# Patient Record
Sex: Male | Born: 1955 | Race: White | Hispanic: No | Marital: Married | Smoking: Former smoker
Health system: Southern US, Academic
[De-identification: ages and names within clinical notes are randomized; demographics above are authoritative.]

## PROBLEM LIST (undated history)

## (undated) DIAGNOSIS — E7800 Pure hypercholesterolemia, unspecified: Secondary | ICD-10-CM

## (undated) DIAGNOSIS — K279 Peptic ulcer, site unspecified, unspecified as acute or chronic, without hemorrhage or perforation: Secondary | ICD-10-CM

## (undated) DIAGNOSIS — E039 Hypothyroidism, unspecified: Secondary | ICD-10-CM

## (undated) DIAGNOSIS — I4891 Unspecified atrial fibrillation: Secondary | ICD-10-CM

## (undated) DIAGNOSIS — H905 Unspecified sensorineural hearing loss: Secondary | ICD-10-CM

## (undated) DIAGNOSIS — E785 Hyperlipidemia, unspecified: Secondary | ICD-10-CM

## (undated) DIAGNOSIS — I10 Essential (primary) hypertension: Secondary | ICD-10-CM

## (undated) DIAGNOSIS — N200 Calculus of kidney: Secondary | ICD-10-CM

## (undated) DIAGNOSIS — E781 Pure hyperglyceridemia: Secondary | ICD-10-CM

## (undated) DIAGNOSIS — M1990 Unspecified osteoarthritis, unspecified site: Secondary | ICD-10-CM

## (undated) DIAGNOSIS — K219 Gastro-esophageal reflux disease without esophagitis: Secondary | ICD-10-CM

## (undated) HISTORY — PX: HX ROTATOR CUFF REPAIR: SHX139

## (undated) HISTORY — PX: HAND SURGERY: SHX662

## (undated) HISTORY — PX: HX GALL BLADDER SURGERY/CHOLE: SHX55

---

## 1997-05-22 ENCOUNTER — Inpatient Hospital Stay (HOSPITAL_COMMUNITY): Payer: Self-pay

## 2013-10-11 IMAGING — CR XRAY KNEE COMPLETE LT
1 series · 3 of 3 positions shown · non-contrast
Comparison: None.

Exam:   
Left knee 3V
INDICATION: Pain.

[Series 1: view not recorded · oblique · 0.17mm/px · 3 of 3 slices shown]
[im 1/3]
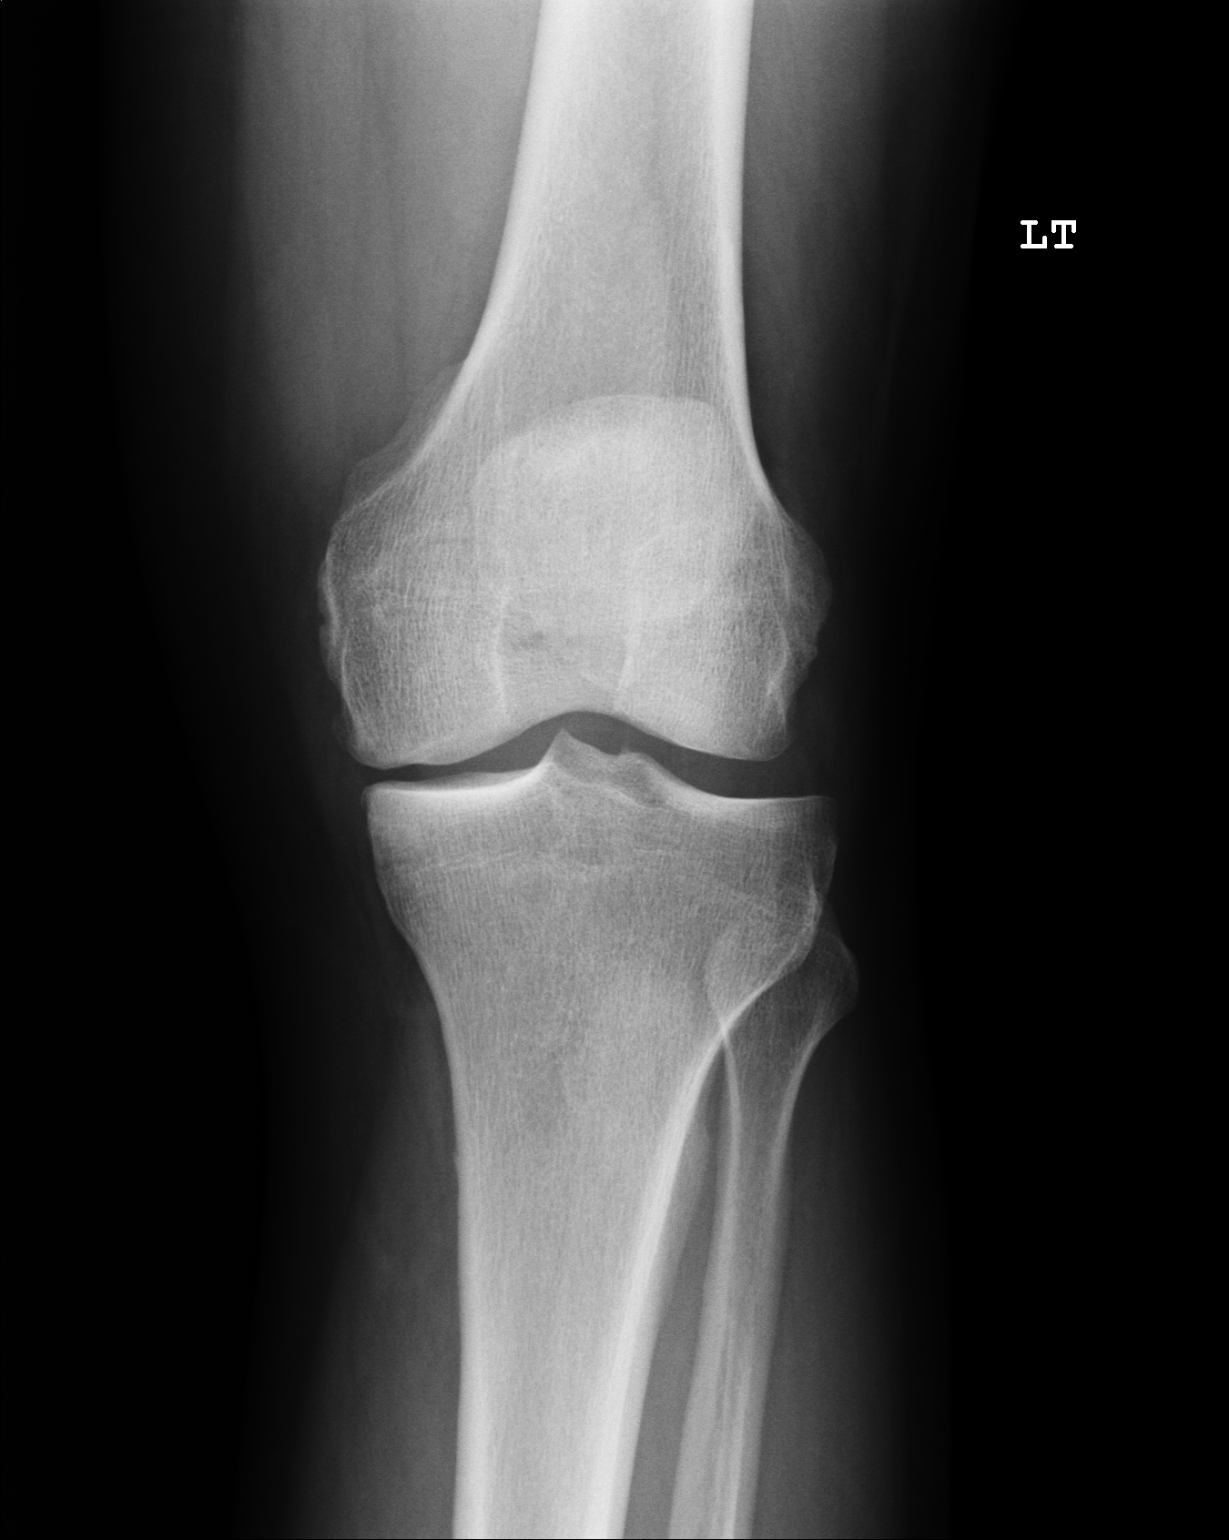
[im 2/3]
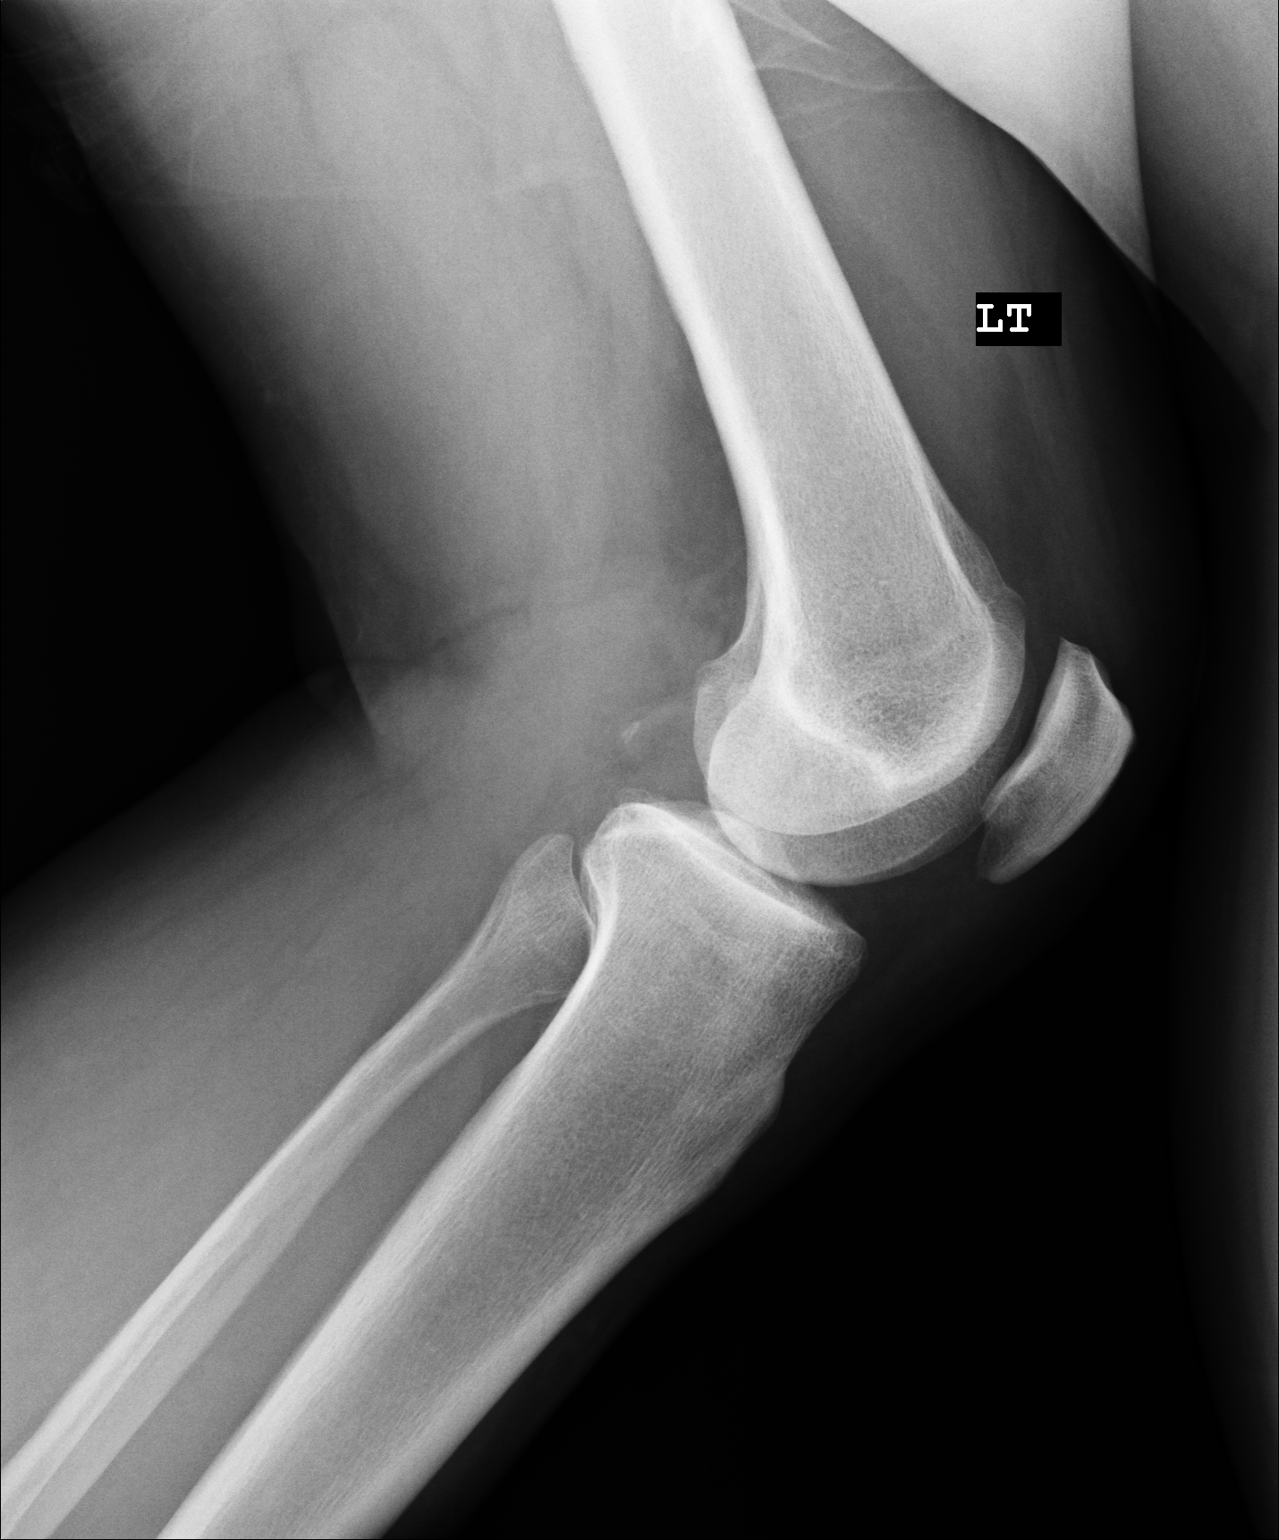
[im 3/3]
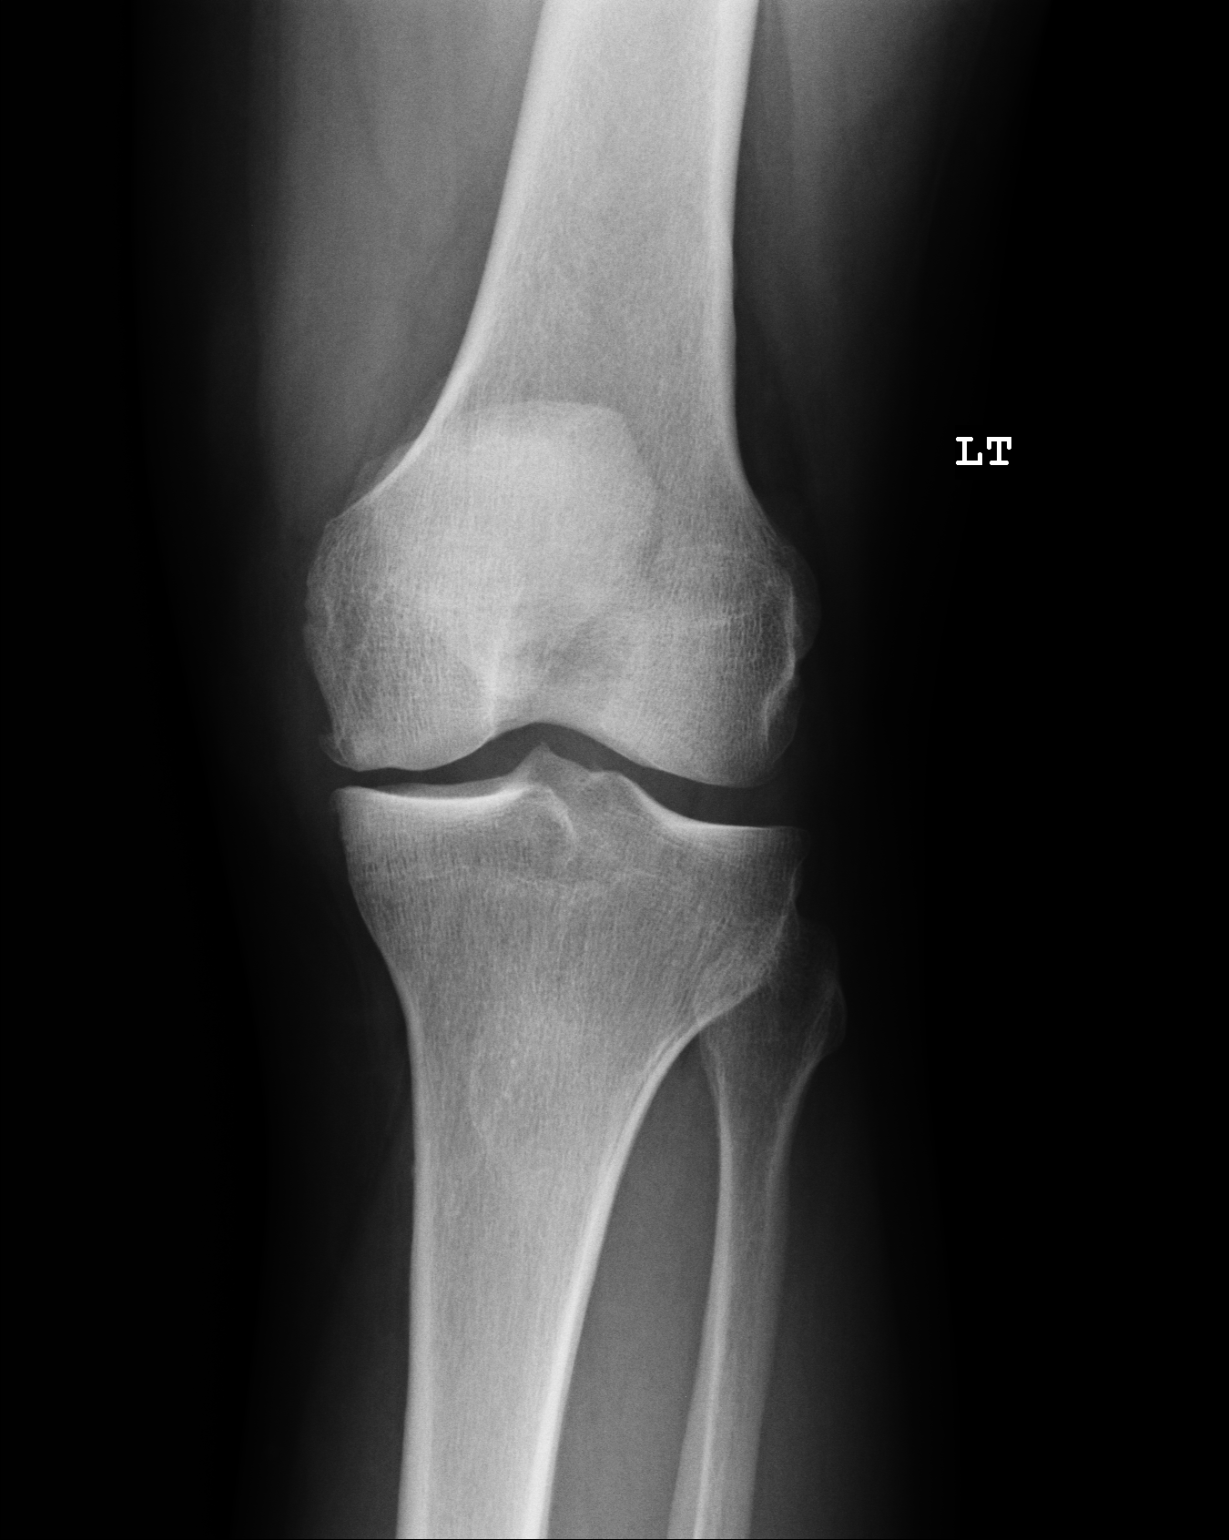

[3 of 3 positions shown; findings below may reference images not displayed]

FINDINGS: There is no evidence of an acute fracture or subluxation. There is mild to moderate narrowing of the medial tibiofemoral joint compartment. There is no suprapatellar effusion. There is no soft tissue abnormality.
IMPRESSION: 1.
Mild to moderate narrowing of the medial tibiofemoral joint compartment. Please consider further evaluation with MRI for persistent or worsening symptoms.

## 2013-10-11 IMAGING — CR XRAY KNEE COMPLETE RT
1 series · 3 of 3 positions shown · non-contrast
Comparison: None.

Exam:   
Right knee 3V
INDICATION: Pain.

[Series 1: view not recorded · oblique · 0.17mm/px · 3 of 3 slices shown]
[im 1/3]
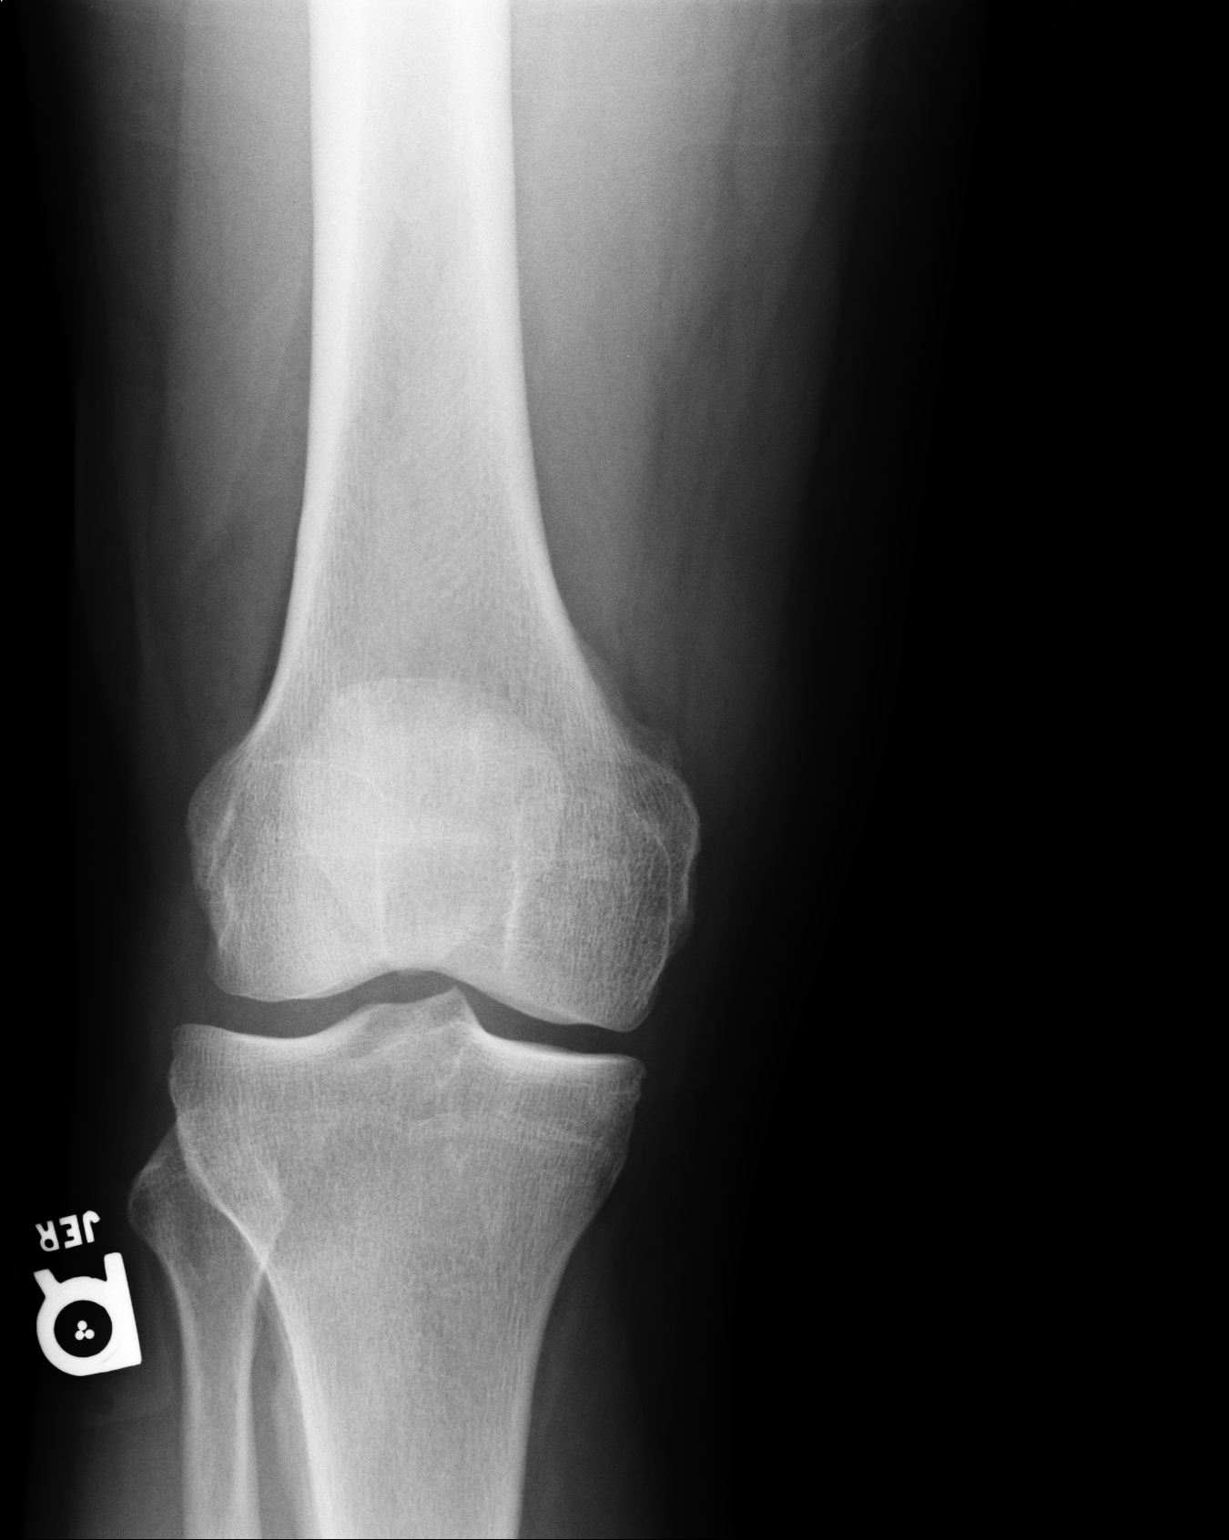
[im 2/3]
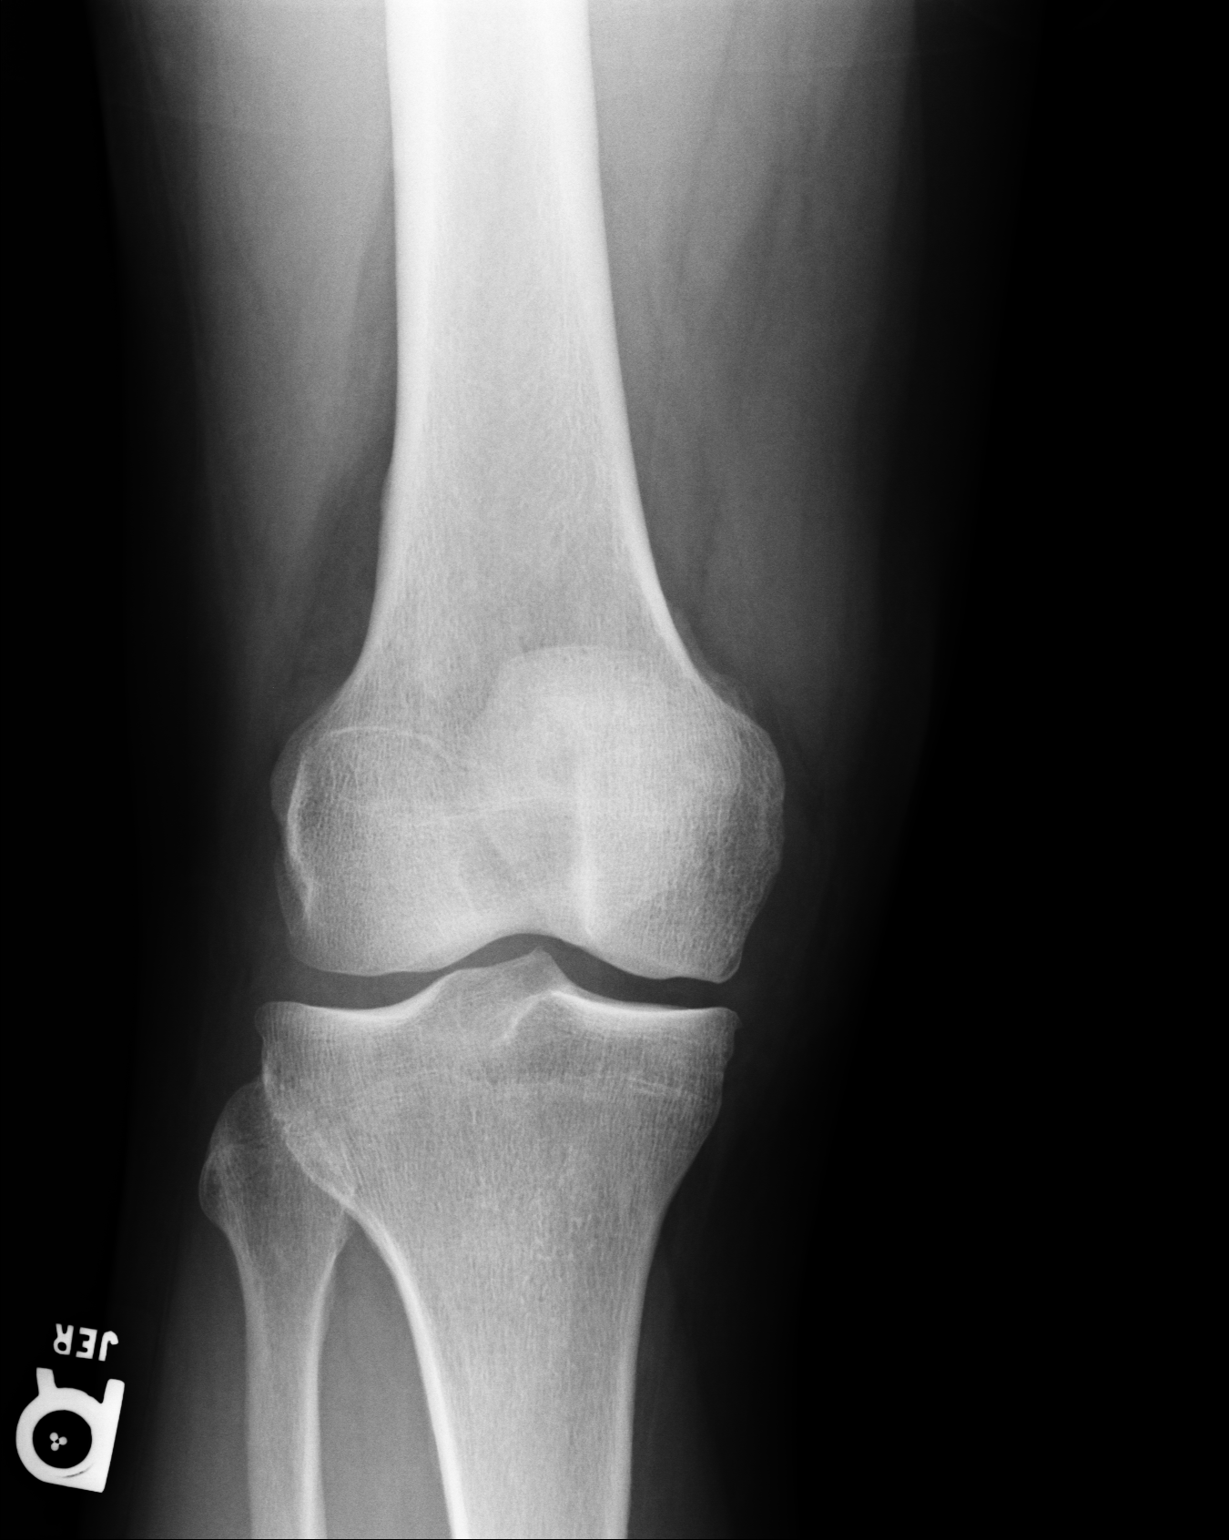
[im 3/3]
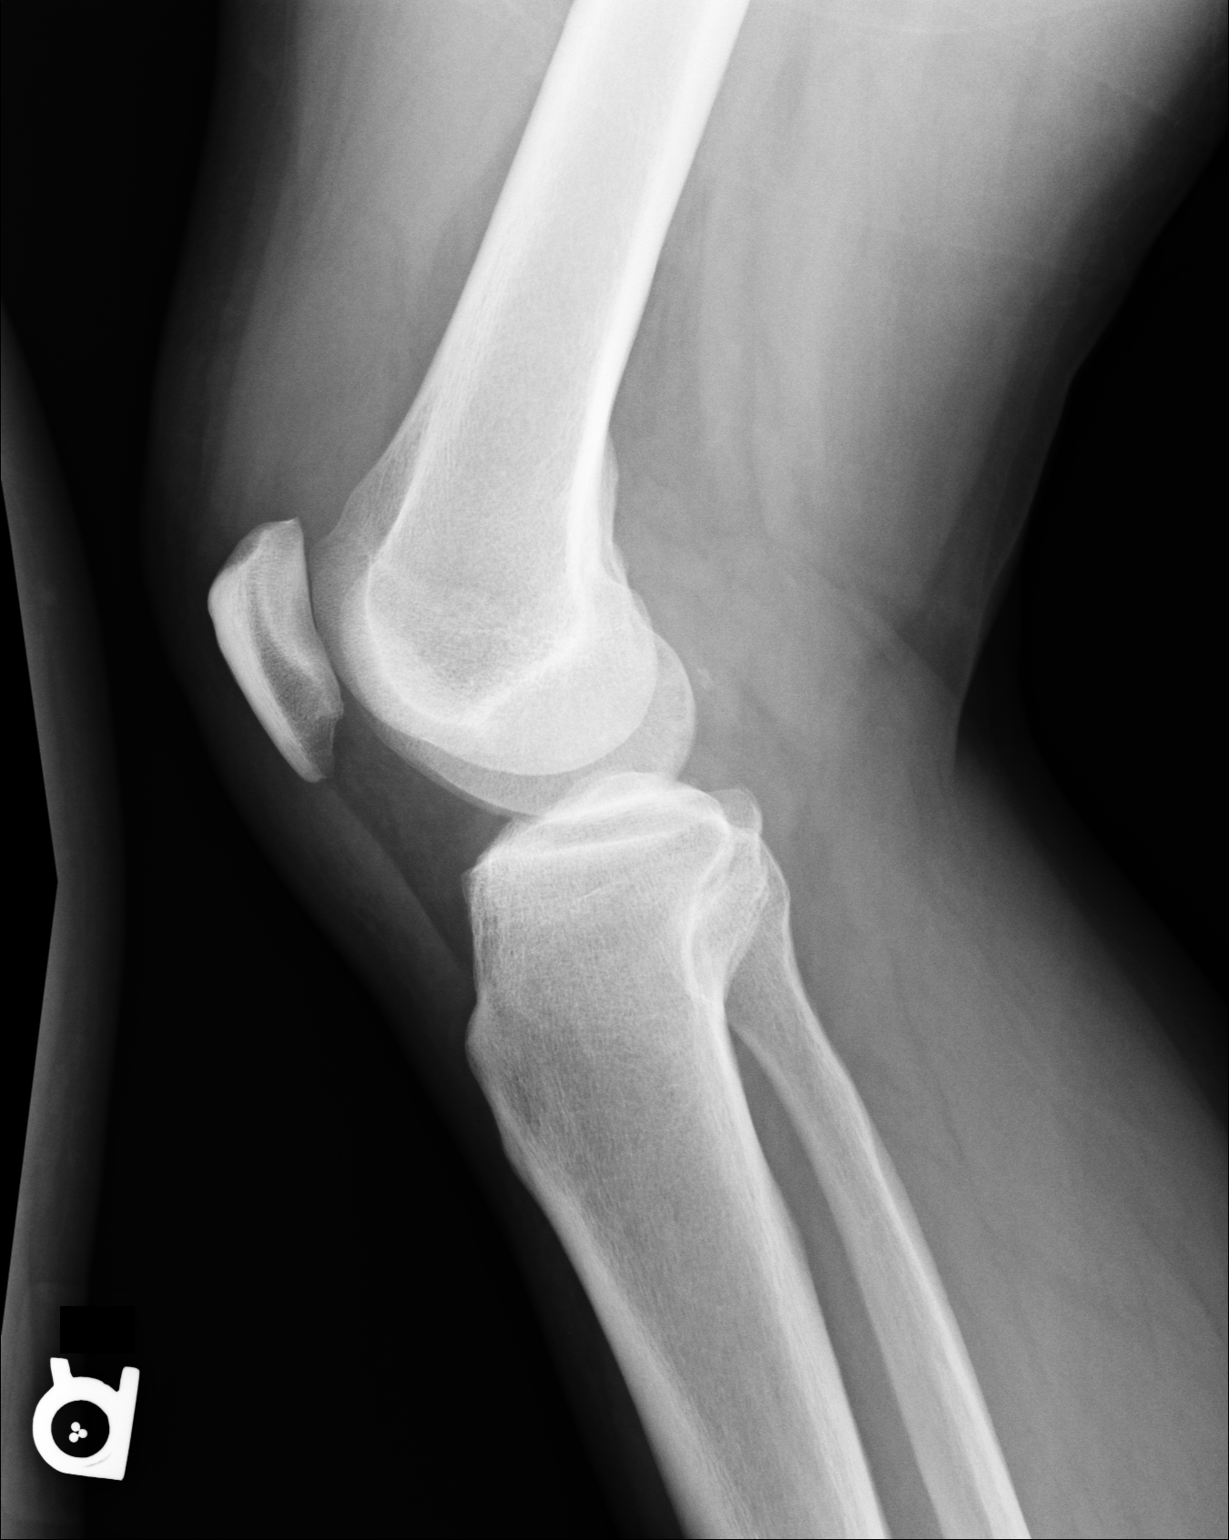

[3 of 3 positions shown; findings below may reference images not displayed]

FINDINGS: The knee is seen in multiple views. No fracture is seen. No joint effusion is appreciated. The joint space is preserved.
IMPRESSION: 1.
Negative right knee.

## 2021-07-28 ENCOUNTER — Other Ambulatory Visit (HOSPITAL_COMMUNITY): Payer: Self-pay | Admitting: Family

## 2021-07-28 DIAGNOSIS — K760 Fatty (change of) liver, not elsewhere classified: Secondary | ICD-10-CM

## 2021-08-14 ENCOUNTER — Other Ambulatory Visit: Payer: Self-pay

## 2021-08-14 ENCOUNTER — Inpatient Hospital Stay
Admission: RE | Admit: 2021-08-14 | Discharge: 2021-08-14 | Disposition: A | Discharge status: Home / Self Care | Payer: Medicare Other | Source: Ambulatory Visit | Attending: Family | Admitting: Family

## 2021-08-14 DIAGNOSIS — K760 Fatty (change of) liver, not elsewhere classified: Secondary | ICD-10-CM

## 2021-09-21 ENCOUNTER — Emergency Department (HOSPITAL_COMMUNITY): Payer: Medicare Other

## 2021-09-21 ENCOUNTER — Other Ambulatory Visit: Payer: Self-pay

## 2021-09-21 ENCOUNTER — Encounter (HOSPITAL_COMMUNITY): Payer: Self-pay

## 2021-09-21 ENCOUNTER — Inpatient Hospital Stay (HOSPITAL_COMMUNITY): Payer: Medicare Other | Admitting: Internal Medicine

## 2021-09-21 ENCOUNTER — Inpatient Hospital Stay
Admission: EM | Admit: 2021-09-21 | Discharge: 2021-09-23 | DRG: 309 | Disposition: A | Discharge status: Home / Self Care | Payer: Medicare Other | Attending: Hospitalist | Admitting: Hospitalist

## 2021-09-21 DIAGNOSIS — I498 Other specified cardiac arrhythmias: Secondary | ICD-10-CM

## 2021-09-21 DIAGNOSIS — M1990 Unspecified osteoarthritis, unspecified site: Secondary | ICD-10-CM | POA: Diagnosis present

## 2021-09-21 DIAGNOSIS — I499 Cardiac arrhythmia, unspecified: Secondary | ICD-10-CM

## 2021-09-21 DIAGNOSIS — K219 Gastro-esophageal reflux disease without esophagitis: Secondary | ICD-10-CM | POA: Diagnosis present

## 2021-09-21 DIAGNOSIS — E039 Hypothyroidism, unspecified: Secondary | ICD-10-CM | POA: Diagnosis present

## 2021-09-21 DIAGNOSIS — Z87891 Personal history of nicotine dependence: Secondary | ICD-10-CM

## 2021-09-21 DIAGNOSIS — I4891 Unspecified atrial fibrillation: Secondary | ICD-10-CM | POA: Diagnosis present

## 2021-09-21 DIAGNOSIS — R0600 Dyspnea, unspecified: Secondary | ICD-10-CM

## 2021-09-21 DIAGNOSIS — H905 Unspecified sensorineural hearing loss: Secondary | ICD-10-CM | POA: Diagnosis present

## 2021-09-21 DIAGNOSIS — E7800 Pure hypercholesterolemia, unspecified: Secondary | ICD-10-CM | POA: Diagnosis present

## 2021-09-21 DIAGNOSIS — E781 Pure hyperglyceridemia: Secondary | ICD-10-CM | POA: Diagnosis present

## 2021-09-21 DIAGNOSIS — I249 Acute ischemic heart disease, unspecified: Secondary | ICD-10-CM

## 2021-09-21 DIAGNOSIS — R079 Chest pain, unspecified: Secondary | ICD-10-CM

## 2021-09-21 DIAGNOSIS — Z87442 Personal history of urinary calculi: Secondary | ICD-10-CM

## 2021-09-21 DIAGNOSIS — Z7982 Long term (current) use of aspirin: Secondary | ICD-10-CM

## 2021-09-21 DIAGNOSIS — R42 Dizziness and giddiness: Secondary | ICD-10-CM

## 2021-09-21 DIAGNOSIS — R0602 Shortness of breath: Secondary | ICD-10-CM

## 2021-09-21 DIAGNOSIS — Z79890 Hormone replacement therapy (postmenopausal): Secondary | ICD-10-CM

## 2021-09-21 DIAGNOSIS — R000 Tachycardia, unspecified: Secondary | ICD-10-CM | POA: Diagnosis present

## 2021-09-21 DIAGNOSIS — I248 Other forms of acute ischemic heart disease: Secondary | ICD-10-CM | POA: Diagnosis present

## 2021-09-21 DIAGNOSIS — Z8711 Personal history of peptic ulcer disease: Secondary | ICD-10-CM

## 2021-09-21 DIAGNOSIS — I471 Supraventricular tachycardia: Principal | ICD-10-CM | POA: Diagnosis present

## 2021-09-21 DIAGNOSIS — Z79899 Other long term (current) drug therapy: Secondary | ICD-10-CM

## 2021-09-21 DIAGNOSIS — R002 Palpitations: Secondary | ICD-10-CM | POA: Diagnosis present

## 2021-09-21 DIAGNOSIS — I10 Essential (primary) hypertension: Secondary | ICD-10-CM | POA: Diagnosis present

## 2021-09-21 HISTORY — DX: Pure hypercholesterolemia, unspecified: E78.00

## 2021-09-21 HISTORY — DX: Unspecified osteoarthritis, unspecified site: M19.90

## 2021-09-21 HISTORY — DX: Hyperlipidemia, unspecified: E78.5

## 2021-09-21 HISTORY — DX: Gastro-esophageal reflux disease without esophagitis: K21.9

## 2021-09-21 HISTORY — DX: Peptic ulcer, site unspecified, unspecified as acute or chronic, without hemorrhage or perforation: K27.9

## 2021-09-21 HISTORY — DX: Pure hyperglyceridemia: E78.1

## 2021-09-21 HISTORY — DX: Hypothyroidism, unspecified: E03.9

## 2021-09-21 HISTORY — DX: Unspecified atrial fibrillation (CMS HCC): I48.91

## 2021-09-21 HISTORY — DX: Essential (primary) hypertension: I10

## 2021-09-21 HISTORY — DX: Unspecified sensorineural hearing loss: H90.5

## 2021-09-21 HISTORY — DX: Calculus of kidney: N20.0

## 2021-09-21 LAB — COMPREHENSIVE METABOLIC PANEL, NON-FASTING
ALBUMIN/GLOBULIN RATIO: 1.5 — ABNORMAL HIGH (ref 0.8–1.4)
ALBUMIN: 4.4 g/dL (ref 3.5–5.7)
ALKALINE PHOSPHATASE: 45 U/L (ref 34–104)
ALT (SGPT): 22 U/L (ref 7–52)
ANION GAP: 11 mmol/L (ref 10–20)
AST (SGOT): 22 U/L (ref 13–39)
BILIRUBIN TOTAL: 0.7 mg/dL (ref 0.3–1.2)
BUN/CREA RATIO: 12 (ref 6–22)
BUN: 16 mg/dL (ref 7–25)
CALCIUM, CORRECTED: 9.1 mg/dL (ref 8.9–10.8)
CALCIUM: 9.5 mg/dL (ref 8.6–10.3)
CHLORIDE: 103 mmol/L (ref 98–107)
CO2 TOTAL: 23 mmol/L (ref 21–31)
CREATININE: 1.38 mg/dL — ABNORMAL HIGH (ref 0.60–1.30)
ESTIMATED GFR: 57 mL/min/{1.73_m2} — ABNORMAL LOW (ref >59)
GLOBULIN: 3 (ref 2.9–5.4)
GLUCOSE: 174 mg/dL — ABNORMAL HIGH (ref 74–109)
OSMOLALITY, CALCULATED: 279 mOsm/kg (ref 270–290)
POTASSIUM: 3.6 mmol/L (ref 3.5–5.1)
PROTEIN TOTAL: 7.4 g/dL (ref 6.4–8.9)
SODIUM: 137 mmol/L (ref 136–145)

## 2021-09-21 LAB — CBC WITH DIFF
BASOPHIL #: 0.1 10*3/uL (ref 0.00–0.30)
BASOPHIL %: 1 % (ref 0–3)
EOSINOPHIL #: 0.1 10*3/uL (ref 0.00–0.80)
EOSINOPHIL %: 1 % (ref 0–7)
HCT: 48.6 % (ref 42.0–51.0)
HGB: 17 g/dL (ref 13.5–18.0)
LYMPHOCYTE #: 2.6 10*3/uL (ref 1.10–5.00)
LYMPHOCYTE %: 23 % — ABNORMAL LOW (ref 25–45)
MCH: 31.7 pg (ref 27.0–32.0)
MCHC: 34.9 g/dL (ref 32.0–36.0)
MCV: 90.8 fL (ref 78.0–99.0)
MONOCYTE #: 1.3 10*3/uL (ref 0.00–1.30)
MONOCYTE %: 12 % (ref 0–12)
MPV: 8.1 fL (ref 7.4–10.4)
NEUTROPHIL #: 6.9 10*3/uL (ref 1.80–8.40)
NEUTROPHIL %: 63 % (ref 40–76)
PLATELETS: 230 10*3/uL (ref 140–440)
RBC: 5.36 10*6/uL (ref 4.20–6.00)
RDW: 13.5 % (ref 11.6–14.8)
WBC: 10.9 10*3/uL — ABNORMAL HIGH (ref 4.0–10.5)
WBCS UNCORRECTED: 10.9 10*3/uL

## 2021-09-21 LAB — ECG 12 LEAD
Calculated R Axis: 98 degrees
Calculated T Axis: -14 degrees
Ventricular rate: 100 {beats}/min

## 2021-09-21 LAB — TROPONIN-I
TROPONIN I: 20 ng/L — ABNORMAL HIGH (ref <20)
TROPONIN I: 25 ng/L — ABNORMAL HIGH (ref <20)
TROPONIN I: 28 ng/L — ABNORMAL HIGH (ref <20)

## 2021-09-21 LAB — MAGNESIUM: MAGNESIUM: 2 mg/dL (ref 1.9–2.7)

## 2021-09-21 LAB — PTT (PARTIAL THROMBOPLASTIN TIME): APTT: 29.9 seconds (ref 26.0–36.0)

## 2021-09-21 LAB — GOLD TOP TUBE

## 2021-09-21 LAB — B-TYPE NATRIURETIC PEPTIDE: BNP: 56 pg/mL (ref 5–100)

## 2021-09-21 LAB — PT/INR
INR: 1.05 (ref <=5.00)
PROTHROMBIN TIME: 12.2 seconds (ref 9.8–12.7)

## 2021-09-21 LAB — BLUE TOP TUBE

## 2021-09-21 MED ORDER — ASPIRIN 81 MG CHEWABLE TABLET
81.0000 mg | CHEWABLE_TABLET | Freq: Every day | ORAL | Status: DC
Start: 2021-09-22 — End: 2021-09-23
  Administered 2021-09-22 – 2021-09-23 (×2): 81 mg via ORAL
  Filled 2021-09-21 (×2): qty 1

## 2021-09-21 MED ORDER — CYANOCOBALAMIN (VIT B-12) 250 MCG TABLET
500.0000 ug | ORAL_TABLET | Freq: Every day | ORAL | Status: DC
Start: 2021-09-22 — End: 2021-09-23
  Administered 2021-09-22 – 2021-09-23 (×2): 500 ug via ORAL
  Filled 2021-09-21 (×2): qty 2

## 2021-09-21 MED ORDER — ENOXAPARIN 40 MG/0.4 ML SUBCUTANEOUS SYRINGE
INJECTION | SUBCUTANEOUS | Status: AC
Start: 2021-09-21 — End: 2021-09-21
  Filled 2021-09-21: qty 0.4

## 2021-09-21 MED ORDER — HYDROCHLOROTHIAZIDE 25 MG TABLET
12.5000 mg | ORAL_TABLET | Freq: Every day | ORAL | Status: DC
Start: 2021-09-22 — End: 2021-09-23
  Administered 2021-09-22 – 2021-09-23 (×2): 12.5 mg via ORAL
  Filled 2021-09-21 (×2): qty 1

## 2021-09-21 MED ORDER — METOPROLOL SUCCINATE ER 25 MG TABLET,EXTENDED RELEASE 24 HR
50.0000 mg | ORAL_TABLET | Freq: Every day | ORAL | Status: DC
Start: 2021-09-22 — End: 2021-09-23
  Administered 2021-09-22 – 2021-09-23 (×2): 50 mg via ORAL
  Filled 2021-09-21 (×2): qty 2

## 2021-09-21 MED ORDER — METOPROLOL TARTRATE 5 MG/5 ML INTRAVENOUS SOLUTION
INTRAVENOUS | Status: AC
Start: 2021-09-21 — End: 2021-09-21
  Filled 2021-09-21: qty 5

## 2021-09-21 MED ORDER — PANTOPRAZOLE 40 MG TABLET,DELAYED RELEASE
40.0000 mg | DELAYED_RELEASE_TABLET | Freq: Every day | ORAL | Status: DC
Start: 2021-09-22 — End: 2021-09-23
  Administered 2021-09-22 – 2021-09-23 (×2): 40 mg via ORAL
  Filled 2021-09-21 (×2): qty 1

## 2021-09-21 MED ORDER — SODIUM CHLORIDE 0.9 % (FLUSH) INJECTION SYRINGE
3.0000 mL | INJECTION | Freq: Three times a day (TID) | INTRAMUSCULAR | Status: DC
Start: 2021-09-21 — End: 2021-09-23
  Administered 2021-09-21 – 2021-09-22 (×2): 3 mL
  Administered 2021-09-22 (×2): 0 mL
  Administered 2021-09-23: 3 mL

## 2021-09-21 MED ORDER — METOPROLOL TARTRATE 25 MG TABLET
25.0000 mg | ORAL_TABLET | ORAL | Status: AC
Start: 2021-09-21 — End: 2021-09-21
  Administered 2021-09-21: 25 mg via ORAL

## 2021-09-21 MED ORDER — METOPROLOL TARTRATE 5 MG/5 ML INTRAVENOUS SOLUTION
2.5000 mg | Freq: Once | INTRAVENOUS | Status: AC | PRN
Start: 2021-09-21 — End: 2021-09-21
  Administered 2021-09-21: 2.5 mg via INTRAVENOUS

## 2021-09-21 MED ORDER — METOPROLOL TARTRATE 5 MG/5 ML INTRAVENOUS SOLUTION
2.5000 mg | INTRAVENOUS | Status: DC
Start: 2021-09-21 — End: 2021-09-23
  Administered 2021-09-21: 0 mg via INTRAVENOUS

## 2021-09-21 MED ORDER — SODIUM CHLORIDE 0.9 % (FLUSH) INJECTION SYRINGE
3.0000 mL | INJECTION | INTRAMUSCULAR | Status: DC | PRN
Start: 2021-09-21 — End: 2021-09-23

## 2021-09-21 MED ORDER — FAMOTIDINE 20 MG TABLET
ORAL_TABLET | ORAL | Status: AC
Start: 2021-09-21 — End: 2021-09-21
  Filled 2021-09-21: qty 2

## 2021-09-21 MED ORDER — ADENOSINE 3 MG/ML INTRAVENOUS SOLUTION
INTRAVENOUS | Status: AC
Start: 2021-09-21 — End: 2021-09-21
  Filled 2021-09-21: qty 2

## 2021-09-21 MED ORDER — LOSARTAN 50 MG TABLET
100.0000 mg | ORAL_TABLET | Freq: Every day | ORAL | Status: DC
Start: 2021-09-22 — End: 2021-09-23
  Administered 2021-09-22 – 2021-09-23 (×2): 100 mg via ORAL
  Filled 2021-09-21 (×2): qty 2

## 2021-09-21 MED ORDER — ATORVASTATIN 10 MG TABLET
40.0000 mg | ORAL_TABLET | Freq: Every day | ORAL | Status: DC
Start: 2021-09-22 — End: 2021-09-23
  Administered 2021-09-22 – 2021-09-23 (×2): 40 mg via ORAL
  Filled 2021-09-21 (×2): qty 4

## 2021-09-21 MED ORDER — ENOXAPARIN 40 MG/0.4 ML SUBCUTANEOUS SYRINGE
40.0000 mg | INJECTION | SUBCUTANEOUS | Status: DC
Start: 2021-09-21 — End: 2021-09-23
  Administered 2021-09-21 – 2021-09-22 (×2): 40 mg via SUBCUTANEOUS
  Filled 2021-09-21: qty 0.4

## 2021-09-21 MED ORDER — LEVOTHYROXINE 50 MCG TABLET
150.0000 ug | ORAL_TABLET | Freq: Every morning | ORAL | Status: DC
Start: 2021-09-22 — End: 2021-09-23
  Administered 2021-09-22 – 2021-09-23 (×2): 150 ug via ORAL
  Filled 2021-09-21 (×2): qty 1

## 2021-09-21 MED ORDER — FAMOTIDINE 20 MG TABLET
40.0000 mg | ORAL_TABLET | Freq: Two times a day (BID) | ORAL | Status: DC
Start: 2021-09-21 — End: 2021-09-23
  Administered 2021-09-21 – 2021-09-23 (×4): 40 mg via ORAL
  Filled 2021-09-21 (×3): qty 2

## 2021-09-21 MED ORDER — LORATADINE 10 MG TABLET
10.0000 mg | ORAL_TABLET | Freq: Every day | ORAL | Status: DC
Start: 2021-09-22 — End: 2021-09-23
  Administered 2021-09-22 – 2021-09-23 (×2): 10 mg via ORAL
  Filled 2021-09-21 (×2): qty 1

## 2021-09-21 MED ORDER — IOHEXOL 350 MG IODINE/ML INTRAVENOUS SOLUTION
50.0000 mL | INTRAVENOUS | Status: AC
Start: 2021-09-21 — End: 2021-09-21
  Administered 2021-09-21: 75 mL via INTRAVENOUS

## 2021-09-21 MED ORDER — ASPIRIN 81 MG CHEWABLE TABLET
324.0000 mg | CHEWABLE_TABLET | ORAL | Status: AC
Start: 2021-09-21 — End: 2021-09-21
  Administered 2021-09-21: 324 mg via ORAL

## 2021-09-21 MED ORDER — ASPIRIN 81 MG CHEWABLE TABLET
CHEWABLE_TABLET | ORAL | Status: AC
Start: 2021-09-21 — End: 2021-09-21
  Filled 2021-09-21: qty 4

## 2021-09-21 MED ORDER — CHOLECALCIFEROL (VITAMIN D3) 25 MCG (1,000 UNIT) TABLET
1000.0000 [IU] | ORAL_TABLET | Freq: Every day | ORAL | Status: DC
Start: 2021-09-22 — End: 2021-09-23
  Administered 2021-09-22: 1000 [IU] via ORAL
  Administered 2021-09-23: 0 [IU] via ORAL
  Filled 2021-09-21: qty 1

## 2021-09-21 MED ORDER — SODIUM CHLORIDE 0.9 % IV BOLUS
1000.0000 mL | INJECTION | Status: AC
Start: 2021-09-21 — End: 2021-09-21
  Administered 2021-09-21: 1000 mL via INTRAVENOUS
  Administered 2021-09-21: 0 mL via INTRAVENOUS

## 2021-09-21 MED ORDER — METOPROLOL TARTRATE 25 MG TABLET
ORAL_TABLET | ORAL | Status: AC
Start: 2021-09-21 — End: 2021-09-21
  Filled 2021-09-21: qty 1

## 2021-09-21 NOTE — ED APP Handoff Note (Signed)
[facility]  Emergency Department  Provider in Triage Note    Name: Collin Hall  Age: 66 y.o.  Gender: male     Subjective:   Collin Hall is a 66 y.o. male who presents with complaint of Tachycardia, Shortness of Breath, and Dizziness  .  He reports three episodes of SOB, tachycardia and dizziness today. He reports h/o atrial fibrillation. Pt does take 81 mg aspirin. He used to see Dr. Marney Doctor, but stopped going several years ago.     Objective:   Filed Vitals:    09/21/21 1651   Pulse: 91   Resp: 20   Temp: 36.1 C (97 F)   SpO2: 96%      Focused Physical Exam shows WNWD male pt.     Assessment:  A medical screening exam was completed.  This patient is a 66 y.o. male with initial findings showing SOB, dizziness.     Plan:  Please see initial orders and work-up below.  This is to be continued with full evaluation in the main Emergency Department.     aspirin chewable tablet 324 mg, 324 mg, Oral, Now       Results for orders placed or performed during the hospital encounter of 09/21/21 (from the past 24 hour(s))   CBC/DIFF    Narrative    The following orders were created for panel order CBC/DIFF.  Procedure                               Abnormality         Status                     ---------                               -----------         ------                     CBC WITH DIFF[[id number]]                                                                 Please view results for these tests on the individual orders.        [place], PA-C  09/21/2021, 16:50

## 2021-09-21 NOTE — H&P (Signed)
[facility]  Admission H&P    Date of Service: 09/21/2021   Collin, Hall, 66 y.o. male  Encounter Start Date:  09/21/2021  Inpatient Admission Date: 09/21/2021  Date of Birth:  28-Oct-1955  PCP: Janie Morning    Information Obtained from: patient  Chief Complaint:  Heart palpitations    HPI: Collin Hall is a 66 y.o., White male who presents with heart palpitations and tachycardia for the last 2-3 weeks.  These episodes have been getting worse and worse, especially with activity and he has had 3 today.  He says he feels like his heart is beating out of his chest and he can feel it beating in his neck and left shoulder.  This is associated with some chest discomfort and tightness along with shortness of breath with exertion.  He feels dizzy when these episodes start.  They do get better with rest.  He has a history of atrial fibrillation and was previously on Rythmol 150 mg p.o. b.i.d. he is to follow with Dr. Janne Lab, but stopped going to see him and stopped taking the Rythmol on his own.  He states that he did not understand what atrial fibrillation was and does not like taking medicines.  He had a heart catheterization by Dr. Briant Sites in [facility] in May of 2017 that showed no coronary disease.  On arrival here today he was in SVT with a rate in the 170s.  He was given metoprolol IV and p.o. by Dr. Aileen Pilot sick and is now in a sinus rhythm with occasional sinus arrhythmia.  PAST MEDICAL:   Past Medical History:   Diagnosis Date   . Atrial fibrillation (CMS HCC)    . Dyslipidemia    . GERD (gastroesophageal reflux disease)    . High cholesterol    . HTN (hypertension)    . Hypertriglyceridemia    . Hypothyroidism    . Osteoarthritis    . Peptic ulcer    . Renal calculi    . Sensorineural hearing loss            Medications Prior to Admission     Prescriptions    aspirin (BAYER LOW DOSE ASPIRIN) 81 mg Oral Tablet, Delayed Release (E.C.)    Take 1 Tablet (81 mg total) by mouth Once a day    carvediloL  (COREG) 6.25 mg Oral Tablet    Take 1 Tablet (6.25 mg total) by mouth Twice daily with food    cholecalciferol, vitamin D3, 25 mcg (1,000 unit) Oral Tablet    Take 1 Tablet (1,000 Units total) by mouth Once a day    cyanocobalamin (VITAMIN B-12) 500 mcg Oral Tablet    Take 1 Tablet (500 mcg total) by mouth Once a day    famotidine (PEPCID) 40 mg Oral Tablet    Take 1 Tablet (40 mg total) by mouth Twice daily    levothyroxine (SYNTHROID) 150 mcg Oral Tablet    Take 1 Tablet (150 mcg total) by mouth Every morning    loratadine (CLARITIN) 10 mg Oral Tablet    Take 1 Tablet (10 mg total) by mouth Once a day    losartan-hydrochlorothiazide (HYZAAR) 100-12.5 mg Oral Tablet    Take 1 Tablet by mouth Once a day    omeprazole (PRILOSEC) 40 mg Oral Capsule, Delayed Release(E.C.)    Take 1 Capsule (40 mg total) by mouth Once a day    Pitavastatin (LIVALO) 4 mg Oral Tablet    Take by mouth  Allergies   Allergen Reactions   . Wellbutrin [Bupropion] Rash     Past Surgical History:   Procedure Laterality Date   . HAND SURGERY     . HX CHOLECYSTECTOMY     . HX ROTATOR CUFF REPAIR Bilateral             Family History:  Family Medical History:    None            Social History:  Social History     Tobacco Use   . Smoking status: Former     Types: Cigarettes   . Smokeless tobacco: Never   Vaping Use   . Vaping Use: Never used   Substance Use Topics   . Alcohol use: Not Currently   . Drug use: Never        Review of Systems:  Review of Systems   Respiratory: Positive for shortness of breath.    All other systems reviewed and are negative.       Examination:  Temperature: 36.1 C (97 F) Heart Rate: 69 BP (Non-Invasive): 118/80   Respiratory Rate: 15 SpO2: 94 %     Physical Exam  Constitutional:       Appearance: He is obese.   HENT:      Head: Normocephalic.      Right Ear: External ear normal.      Left Ear: External ear normal.      Nose: Nose normal.      Mouth/Throat:      Mouth: Mucous membranes are moist.      Pharynx:  Oropharynx is clear.   Eyes:      Extraocular Movements: Extraocular movements intact.      Conjunctiva/sclera: Conjunctivae normal.      Pupils: Pupils are equal, round, and reactive to light.   Cardiovascular:      Rate and Rhythm: Normal rate. Rhythm irregular.      Pulses: Normal pulses.      Heart sounds: Normal heart sounds.   Pulmonary:      Effort: Pulmonary effort is normal.      Breath sounds: Normal breath sounds.   Abdominal:      General: Bowel sounds are normal.      Palpations: Abdomen is soft.   Musculoskeletal:         General: Normal range of motion.      Cervical back: Normal range of motion and neck supple.   Skin:     General: Skin is warm and dry.      Capillary Refill: Capillary refill takes less than 2 seconds.   Neurological:      General: No focal deficit present.      Mental Status: He is alert and oriented to person, place, and time.   Psychiatric:         Mood and Affect: Mood normal.         Behavior: Behavior normal.          Labs:    Lab Results Today:    Results for orders placed or performed during the hospital encounter of 09/21/21 (from the past 24 hour(s))   COMPREHENSIVE METABOLIC PANEL, NON-FASTING   Result Value Ref Range    SODIUM 137 136 - 145 mmol/L    POTASSIUM 3.6 3.5 - 5.1 mmol/L    CHLORIDE 103 98 - 107 mmol/L    CO2 TOTAL 23 21 - 31 mmol/L    ANION GAP 11 10 - 20 mmol/L  BUN 16 7 - 25 mg/dL    CREATININE 1.38 (H) 0.60 - 1.30 mg/dL    BUN/CREA RATIO 12 6 - 22    ESTIMATED GFR 57 (L) >59 mL/min/1.5m^2    ALBUMIN 4.4 3.5 - 5.7 g/dL    CALCIUM 9.5 8.6 - 10.3 mg/dL    GLUCOSE 174 (H) 74 - 109 mg/dL    ALKALINE PHOSPHATASE 45 34 - 104 U/L    ALT (SGPT) 22 7 - 52 U/L    AST (SGOT) 22 13 - 39 U/L    BILIRUBIN TOTAL 0.7 0.3 - 1.2 mg/dL    PROTEIN TOTAL 7.4 6.4 - 8.9 g/dL    ALBUMIN/GLOBULIN RATIO 1.5 (H) 0.8 - 1.4    OSMOLALITY, CALCULATED 279 270 - 290 mOsm/kg    CALCIUM, CORRECTED 9.1 8.9 - 10.8 mg/dL    GLOBULIN 3.0 2.9 - 5.4   B-TYPE NATRIURETIC PEPTIDE   Result Value  Ref Range    BNP 56 5 - 100 pg/mL   MAGNESIUM   Result Value Ref Range    MAGNESIUM 2.0 1.9 - 2.7 mg/dL   PTT (PARTIAL THROMBOPLASTIN TIME)   Result Value Ref Range    APTT 29.9 26.0 - 36.0 seconds   PT/INR   Result Value Ref Range    PROTHROMBIN TIME 12.2 9.8 - 12.7 seconds    INR 1.05 <=5.00   CBC WITH DIFF   Result Value Ref Range    WBCS UNCORRECTED 10.9 x10^3/uL    WBC 10.9 (H) 4.0 - 10.5 x10^3/uL    RBC 5.36 4.20 - 6.00 x10^6/uL    HGB 17.0 13.5 - 18.0 g/dL    HCT 48.6 42.0 - 51.0 %    MCV 90.8 78.0 - 99.0 fL    MCH 31.7 27.0 - 32.0 pg    MCHC 34.9 32.0 - 36.0 g/dL    RDW 13.5 11.6 - 14.8 %    PLATELETS 230 140 - 440 x10^3/uL    MPV 8.1 7.4 - 10.4 fL    NEUTROPHIL % 63 40 - 76 %    LYMPHOCYTE % 23 (L) 25 - 45 %    MONOCYTE % 12 0 - 12 %    EOSINOPHIL % 1 0 - 7 %    BASOPHIL % 1 0 - 3 %    NEUTROPHIL # 6.90 1.80 - 8.40 x10^3/uL    LYMPHOCYTE # 2.60 1.10 - 5.00 x10^3/uL    MONOCYTE # 1.30 0.00 - 1.30 x10^3/uL    EOSINOPHIL # 0.10 0.00 - 0.80 x10^3/uL    BASOPHIL # 0.10 0.00 - 0.30 x10^3/uL   ECG 12 LEAD   Result Value Ref Range    Ventricular rate 100 BPM    Atrial Rate 94 BPM    PR Interval 158 ms    QRS Duration 74 ms    QT Interval 348 ms    QTC Calculation 448 ms    Calculated P Axis 20 degrees    Calculated R Axis 98 degrees    Calculated T Axis -14 degrees   BLUE TOP TUBE   Result Value Ref Range    RAINBOW/EXTRA TUBE AUTO RESULT Yes    TROPONIN-I NOW   Result Value Ref Range    TROPONIN I 20 (H) <20 ng/L   TROPONIN-I IN ONE HOUR   Result Value Ref Range    TROPONIN I 25 (H) <20 ng/L       Imaging Studies:  No results found.    DNR Status:  Full Code    Assessment/Plan:   Active Hospital Problems    Diagnosis   . Primary Problem: Tachyarrhythmia     Continue metoprolol.  Hold patient's home carvedilol.  Admit to telemetry with continuous monitoring.  Echocardiogram and pharmacologic stress test in a.m..  Consult Cardiology for input.  Prophylactic dose Lovenox for DVT prophylaxis.    DVT/PE Prophylaxis:  Lovenox    Wilburn Mylar, DO

## 2021-09-21 NOTE — ED Triage Notes (Signed)
States he had 3 episodes of shortness of breath today with tachycardia and dizziness. States has history of heart palpatations.

## 2021-09-21 NOTE — ED Nurses Note (Signed)
Report called to [facility] at this time.

## 2021-09-21 NOTE — ED Provider Notes (Signed)
[facility]  ED Primary Provider Note  Patient Name: Collin Hall  Patient Age: 66 y.o.  Date of Birth: 02-05-1956    Chief Complaint: Tachycardia, Shortness of Breath, and Dizziness        History of Present Illness       Collin Hall is a 66 y.o. male who had concerns including Tachycardia, Shortness of Breath, and Dizziness.  This patient is a 66 year old male who had previously followed with Dr. Van Clines, for tachycardia, who presents with palpitations, and tachycardia.  The patient historically was on Rythmol, but stopped taking it because he did not feel like he needed it.        Review of Systems     No other overt Review of Systems are noted to be positive except noted in the HPI.      Historical Data   History Reviewed This Encounter:        Physical Exam   ED Triage Vitals   BP (Non-Invasive) 09/21/21 1656 (!) 168/105   Heart Rate 09/21/21 1651 91   Respiratory Rate 09/21/21 1651 20   Temperature 09/21/21 1651 36.1 C (97 F)   SpO2 09/21/21 1651 96 %   Weight 09/21/21 1651 113 kg (250 lb)   Height 09/21/21 1651 1.753 m (5\' 9" )         Nursing notes reviewed for what could be assessed. Past Medical, Surgical, and Social history reviewed for what has been completed. Exam limited in the setting of personal protective equipment.    Constitutional: NAD. Well-Developed. Well Nourished.  Head: Normocephalic, atraumatic.  Ears:  Normal to exterior evaluation  Eyes: EOM grossly intact, conjunctiva normal.  Neck: Supple  Cardiovascular:  Intermittent tachycardic irregular Rate and Rhythm, extremities well perfused.  Pulmonary/Chest: No respiratory distress. Lungs are symmetric to auscultation bilaterally.  Abdominal: Soft, non-tender, non-distended. Non peritoneal, no rebound, no guarding.  MSK: No Lower Extremity Edema.  Skin: Warm, dry, and intact  Neuro: Appropriate, CN II-XII grossly intact.  Psych: Pleasant              Procedures      Patient Data     Labs  Ordered/Reviewed   CBC WITH DIFF - Abnormal; Notable for the following components:       Result Value    WBC 10.9 (*)     LYMPHOCYTE % 23 (*)     All other components within normal limits   CBC/DIFF    Narrative:     The following orders were created for panel order CBC/DIFF.  Procedure                               Abnormality         Status                     ---------                               -----------         ------                     CBC WITH DIFF[[id number]]                Abnormal            Final result  Please view results for these tests on the individual orders.   COMPREHENSIVE METABOLIC PANEL, NON-FASTING   TROPONIN-I   TROPONIN-I   TROPONIN-I   B-TYPE NATRIURETIC PEPTIDE   MAGNESIUM   PTT (PARTIAL THROMBOPLASTIN TIME)   PT/INR   EXTRA TUBES    Narrative:     The following orders were created for panel order EXTRA TUBES.  Procedure                               Abnormality         Status                     ---------                               -----------         ------                     BLUE TOP TUBE[[id number]]                                    In process                 GOLD TOP TUBE[[id number]]                                    In process                   Please view results for these tests on the individual orders.   BLUE TOP TUBE   GOLD TOP TUBE       XR AP MOBILE CHEST   Final Result by Edi, Radresults In (06/05 1714)   NO ACUTE FINDINGS.         Radiologist location ID: [id number]             Medical Decision Making          MDM      Studies Assessed:  Lab, EKG, radiology    Initial EKG:   This EKG interpreted by me shows:    Rate:  177 beats per minute    Interpretation:  Grossly regular rhythm, no consistent ST segment changes to meet STEMI criteria.    Follow-up EKG at 5:02 p.m.  This EKG interpreted by me shows:    Rate:  100 beats per minute    Interpretation:  PR 158, irregular rhythm, No consistent ST Elevation, No Acute STEMI Identified.  S1 Q3 T3 identified  which was also present on previous EKG.          MDM Narrative:  This patient is a 66 year old male who presents with a tachycardic heart rate.  Differential includes AVNRT, AVNRT, AFib with RVR.  The patient states he has history of AFib common has not been taking his anti arrhythmic for quite some time because he did not feel like he needed it.  Patient had multiple episodes of tachycardia in the emergency department, including triage, in the treatment room.  The patient was able to undergo successful vagal maneuvers.  He was given oral metoprolol, and a small dose of IV metoprolol.  The metoprolol.  To control his rate.  CT  angiography did not show a PE, but given the multitude of tachycardia common the frequent recurrence, case will be discussed with hospitalist for admission evaluation for optimization of therapy.      ED Course as of 09/21/21 1722   Mon Sep 21, 2021   1720 Patient had a tachyarrhythmia in the 170s.  Successful vagal maneuver conducted.  2.5 mg of IV metoprolol ordered.         Medications Administered in the ED   NS bolus infusion 1,000 mL (1,000 mL Intravenous New Bag/New Syringe 09/21/21 1710)   metoprolol (LOPRESSOR) 1 mg/mL injection (has no administration in time range)   metoprolol (LOPRESSOR) 1 mg/mL injection (has no administration in time range)   aspirin chewable tablet 324 mg (324 mg Oral Given 09/21/21 1709)   metoprolol tartrate (LOPRESSOR) tablet (25 mg Oral Given 09/21/21 1713)       Patient will be admitted to the  service for further workup and management.    Disposition: Admitted               Clinical Impression   Tachycardia (Primary)         There are no discharge medications for this patient.        Aleatha Borer, MD, [facility]  Department of Emergency Medicine

## 2021-09-22 ENCOUNTER — Inpatient Hospital Stay (HOSPITAL_COMMUNITY): Payer: Medicare Other

## 2021-09-22 ENCOUNTER — Encounter (HOSPITAL_COMMUNITY): Payer: Self-pay | Admitting: Internal Medicine

## 2021-09-22 DIAGNOSIS — E039 Hypothyroidism, unspecified: Secondary | ICD-10-CM

## 2021-09-22 DIAGNOSIS — I10 Essential (primary) hypertension: Secondary | ICD-10-CM

## 2021-09-22 DIAGNOSIS — E785 Hyperlipidemia, unspecified: Secondary | ICD-10-CM

## 2021-09-22 LAB — CBC WITH DIFF
BASOPHIL #: 0.1 10*3/uL (ref 0.00–0.30)
BASOPHIL %: 1 % (ref 0–3)
EOSINOPHIL #: 0.1 10*3/uL (ref 0.00–0.80)
EOSINOPHIL %: 1 % (ref 0–7)
HCT: 41.6 % — ABNORMAL LOW (ref 42.0–51.0)
HGB: 14.7 g/dL (ref 13.5–18.0)
LYMPHOCYTE #: 2.2 10*3/uL (ref 1.10–5.00)
LYMPHOCYTE %: 29 % (ref 25–45)
MCH: 31.9 pg (ref 27.0–32.0)
MCHC: 35.3 g/dL (ref 32.0–36.0)
MCV: 90.5 fL (ref 78.0–99.0)
MONOCYTE #: 0.9 10*3/uL (ref 0.00–1.30)
MONOCYTE %: 12 % (ref 0–12)
MPV: 8.2 fL (ref 7.4–10.4)
NEUTROPHIL #: 4.4 10*3/uL (ref 1.80–8.40)
NEUTROPHIL %: 58 % (ref 40–76)
PLATELETS: 180 10*3/uL (ref 140–440)
RBC: 4.6 10*6/uL (ref 4.20–6.00)
RDW: 13.6 % (ref 11.6–14.8)
WBC: 7.7 10*3/uL (ref 4.0–10.5)
WBCS UNCORRECTED: 7.7 10*3/uL

## 2021-09-22 LAB — BASIC METABOLIC PANEL
ANION GAP: 4 mmol/L — ABNORMAL LOW (ref 10–20)
BUN/CREA RATIO: 15 (ref 6–22)
BUN: 15 mg/dL (ref 7–25)
CALCIUM: 8.5 mg/dL — ABNORMAL LOW (ref 8.6–10.3)
CHLORIDE: 107 mmol/L (ref 98–107)
CO2 TOTAL: 28 mmol/L (ref 21–31)
CREATININE: 1.03 mg/dL (ref 0.60–1.30)
ESTIMATED GFR: 81 mL/min/{1.73_m2} (ref >59)
GLUCOSE: 113 mg/dL — ABNORMAL HIGH (ref 74–109)
OSMOLALITY, CALCULATED: 279 mOsm/kg (ref 270–290)
POTASSIUM: 3.9 mmol/L (ref 3.5–5.1)
SODIUM: 139 mmol/L (ref 136–145)

## 2021-09-22 LAB — ECG 12 LEAD
Atrial Rate: 94 {beats}/min
Calculated P Axis: 20 degrees
Calculated R Axis: 149 degrees
Calculated T Axis: -17 degrees
PR Interval: 158 ms
QRS Duration: 74 ms
QRS Duration: 78 ms
QT Interval: 262 ms
QT Interval: 348 ms
QTC Calculation: 448 ms
QTC Calculation: 449 ms
Ventricular rate: 177 {beats}/min

## 2021-09-22 LAB — LIPID PANEL
CHOL/HDL RATIO: 3.6
CHOLESTEROL: 135 mg/dL (ref <200)
HDL CHOL: 38 mg/dL (ref 23–92)
LDL CALC: 70 mg/dL (ref 0–100)
TRIGLYCERIDES: 134 mg/dL (ref <=150)
VLDL CALC: 27 mg/dL (ref 0–50)

## 2021-09-22 LAB — MAGNESIUM: MAGNESIUM: 2.2 mg/dL (ref 1.9–2.7)

## 2021-09-22 MED ORDER — REGADENOSON 0.4 MG/5 ML INTRAVENOUS SYRINGE
0.4000 mg | INJECTION | INTRAVENOUS | Status: AC
  Administered 2021-09-22: 0.4 mg via INTRAVENOUS
  Filled 2021-09-22: qty 5

## 2021-09-22 NOTE — Progress Notes (Signed)
[facility]   Medicine Progress Note    Jolyne Loa  Date of service: 09/22/2021  Date of Admission:  09/21/2021    Hospital Day:  LOS: 1 day     A/P:    66 y.o., White male who presented with heart palpitations and tachycardia for the last 2-3 weeks.  These episodes have been getting worse and worse, especially with activity   He says he feels like his heart is beating out of his chest and he can feel it beating in his neck and left shoulder.  This is associated with some chest discomfort and tightness along with shortness of breath with exertion.  He feels dizzy when these episodes start.  They do get better with rest.  He has a history of atrial fibrillation and was previously on Rythmol 150 mg p.o. b.i.d. he is to follow with Dr. Lonzo Cloud, but stopped going to see him and stopped taking the Rythmol on his own. On arrival here he was in SVT with a rate in the 170s.  He was given metoprolol IV and p.o. by Dr. Gay Filler.  Patient's CT angio was negative for PE    1. Tachyarrhythmia:  Continue metoprolol.  Follow-up echo and stress test.  Follow-up cardiology recommendations.  Patient's troponin is slightly elevated but flat a likely due to demand.  2. Previous history of atrial fibrillation.   3. Hypertension:  Continue Hyzaar  4. Hypothyroidism.  Continue Synthroid  5. Hyperlipidemia.  Continue statin.   6.  Continue patient's remaining medications from home      Subjective:     Patient denies any chest pain, palpitation or shortness of breath.  No nausea    Vital Signs:  Temp  Avg: 36.4 C (97.6 F)  Min: 36.1 C (97 F)  Max: 37 C (98.6 F)    Pulse  Avg: 62.6  Min: 0  Max: 98 BP  Min: 93/48  Max: 168/105   Resp  Avg: 19.3  Min: 15  Max: 25 SpO2  Avg: 94.6 %  Min: 93 %  Max: 96 %          Physical Exam:  Constitutional: no distress  Respiratory: Clear to auscultation bilaterally.   Cardiovascular: regular rate and rhythm  Gastrointestinal: Soft, non-tender, Bowel sounds  normal  Musculoskeletal: No edema of lower extremities  Neuro:     Today's Labs and medication Reviewed.         Harless Litten, MD

## 2021-09-22 NOTE — Consults (Signed)
[place] MEDICINE [facility]  Cardiology Consultation    Date of Service:  09/22/2021  Collin Hall   66 y.o. male  Date of Admission:  09/21/2021  Date of Birth:  October 04, 1955    REFERRING PROVIDER:  Harless Litten MD    REASON FOR CONSULT:  Chest pain    HPI: Collin Hall is a 66 y.o., White male who presents with chest pains.    He had heart palpitations and tachycardia for the last 2-3 weeks.  These episodes have been getting worse worse, especially with activity and he has had 3 episodes on day of presentation to hospital.    Patient claimed that he feels like his heart is beating out of his chest and he can feel it beating in his neck and left shoulder.   These episodes are associated with chest discomfort and tightness along with shortness of breath with exertion.  He feels dizzy when these episodes occurred.   he feels better when he rests .      He has a history of atrial fibrillation and was previously on Rythmol 150 mg p.o. b.i.d..  Patient used to follow with with me for cardiac needs and stop coming to office couple years ago and also stopped taking his medication on his own.    He had a heart catheterization by Dr. Doylene Canard in [place] in May of 2017 that showed no coronary disease.      On arrival to [facility] Emergency room, he had tachycardia with rate of 170-180 beats per minute .  The patient was was given metoprolol IV and p.o. which helped to resolve his tachycardia and since then he has occasional PACs and has sinus rhythm.  Cardiac consult was ordered and patient was seen examined by me in cardiology department and as of my dictation cardiac stress test and echocardiography are completed.    Patient has no fever chills or productive cough.  He has or increased phlegmon production.  Pain was nonpleuritic that he has no hemoptysis.  No nausea vomiting or abdominal pain    HISTORY:   Past Medical:    Past Medical History:   Diagnosis Date   . Atrial fibrillation (CMS HCC)    .  Dyslipidemia    . GERD (gastroesophageal reflux disease)    . High cholesterol    . HTN (hypertension)    . Hypertriglyceridemia    . Hypothyroidism    . Osteoarthritis    . Peptic ulcer    . Renal calculi    . Sensorineural hearing loss       Past Surgical:    Past Surgical History:   Procedure Laterality Date   . HAND SURGERY     . HX CHOLECYSTECTOMY     . HX ROTATOR CUFF REPAIR Bilateral       Family:    Family Medical History:    None        Social:   reports that he has quit smoking. His smoking use included cigarettes. He has never used smokeless tobacco. He reports that he does not currently use alcohol. He reports that he does not use drugs.     ALLERGIES:   Allergies   Allergen Reactions   . Wellbutrin [Bupropion] Rash       HOME MEDICATIONS:  Medications Prior to Admission     Prescriptions    aspirin (BAYER LOW DOSE ASPIRIN) 81 mg Oral Tablet, Delayed Release (E.C.)    Take 1 Tablet (  81 mg total) by mouth Once a day    carvediloL (COREG) 6.25 mg Oral Tablet    Take 1 Tablet (6.25 mg total) by mouth Twice daily with food    cholecalciferol, vitamin D3, 25 mcg (1,000 unit) Oral Tablet    Take 1 Tablet (1,000 Units total) by mouth Once a day    cyanocobalamin (VITAMIN B-12) 500 mcg Oral Tablet    Take 1 Tablet (500 mcg total) by mouth Once a day    famotidine (PEPCID) 40 mg Oral Tablet    Take 1 Tablet (40 mg total) by mouth Twice daily    levothyroxine (SYNTHROID) 150 mcg Oral Tablet    Take 1 Tablet (150 mcg total) by mouth Every morning    loratadine (CLARITIN) 10 mg Oral Tablet    Take 1 Tablet (10 mg total) by mouth Once a day    losartan-hydrochlorothiazide (HYZAAR) 100-12.5 mg Oral Tablet    Take 1 Tablet by mouth Once a day    omeprazole (PRILOSEC) 40 mg Oral Capsule, Delayed Release(E.C.)    Take 1 Capsule (40 mg total) by mouth Once a day    Pitavastatin (LIVALO) 4 mg Oral Tablet    Take by mouth        CURRENT SCHEDULED MEDICATIONS:  .  aspirin chewable tablet 81 mg 81 mg Daily  .  atorvastatin  (LIPITOR) tablet 40 mg Daily  .  cholecalciferol (VITAMIN D3) 1000 unit (25 mcg) tablet 1,000 Units Daily  .  cyanocobalamin (VITAMIN B12) tablet 500 mcg Daily  .  enoxaparin PF (LOVENOX) 40 mg/0.4 mL SubQ injection 40 mg Q24H  .  famotidine (PEPCID) tablet 40 mg 2x/day  .  hydroCHLOROthiazide (HYDRODIURIL) tablet 12.5 mg Daily  .  levothyroxine (SYNTHROID) tablet 150 mcg 150 mcg QAM  .  loratadine (CLARITIN) tablet 10 mg Daily  .  losartan (COZAAR) tablet 100 mg Daily  .  metoprolol (LOPRESSOR) 1 mg/mL injection 2.5 mg Now  .  metoprolol succinate (TOPROL-XL) 24 hr extended release tablet 50 mg Daily  .  NS flush syringe 3 mL Q8HRS  .  pantoprazole (PROTONIX) delayed release tablet 40 mg Daily   CURRENT PRN MEDICATIONS:  .  NS flush syringe, 3 mL, Q1H PRN     REVIEW OF SYSTEMS:   GENERAL: The patient denies any weight change, fevers, chills or night sweats.   + dizziness. No LOC.   SKIN: No rashes or sores. No cyanosis or jaundice  HEAD: No trauma, no headache. No dizziness. No tremors  EYES: No blurriness, no acute visual loss. no discharge.   EARS and nose: No hearing loss, no tinnitus. No discharge.  No epistaxis.  THROAT: No bleeding gums, no sore throat. No drainage. No dysphagia or odynophagia  CARDIAC: Reports chest pain, No diaphoresis. + palpitations. No syncope or collapse.  RESPIRATORY: + shortness of breath, wheezing or cough. no hemoptysis. No pleuritic pain.  GI: Reports no nausea. No vomiting or diarrhea. No abdominal pain. No hematemesis or melena.  GU: No polyuria, no dysuria. No urgency. No hematuria  MUSCULOSKELETAL: No muscle weakness or increased joint stiffness or acute painful joints.   EXTREMITIES: No significant swelling reported. No significant erythema.  NEUROLOGICAL: No numbness, tingling or tremors. No acute paralysis.  HEMATOLOGICAL: No easy bruising or bleeding.  PSYCHIATRIC: No acute major anxiety or acute major depression. No suicidal behavior or ideation.    PHYSICAL EXAMINATION:     Filed Vitals:    09/22/21 0400 09/22/21 0823 09/22/21 1115  09/22/21 1554   BP: (!) 93/48 132/89  (!) 146/84   Pulse: 56 58 59 54   Resp:  20  20   Temp:  37 C (98.6 F)  36.4 C (97.6 F)   SpO2: 93% 94%  96%      General: Alert, awake and in no acute distress. There is no eye ear or nasal discharge and HEENT is examined and is unremarkable.  Collin Hall been sending it randomly  Tongue:  Is central and moist no deviation. No cyanosis of the tongue.  Neck: Is supple without evident JVD.  There is no bruit and no palpable thyromegaly or Lymphadenopathy. Adequate carotid upstroke bilaterally.  Lungs: Are essentially clear to auscultation.  Heart: Regular S1-S2. There is no S3 or S4. No systolic murmur.   No diastolic murmur. No  gallop, rub or click.  PMI is nondisplaced.  Abdomen: Is soft, bowel sounds are positive and no palpable organomegaly.  Non tender.  Extremities: Revealed no clubbing or cyanosis and there is no significant edema.  Distal Pulses are palpable throughout.  Skin: Has no jaundice or cyanosis.  Neuro and psychiatric and psychological: Normal affect. Nonagitated. No head tremors noted.                                                                    No obvious focal deficit.            LABS:   BMP:      Basic Metabolic Profile    Lab Results   Component Value Date/Time    SODIUM 139 09/22/2021 05:43 AM    POTASSIUM 3.9 09/22/2021 05:43 AM    CHLORIDE 107 09/22/2021 05:43 AM    CO2 28 09/22/2021 05:43 AM    ANIONGAP 4 (L) 09/22/2021 05:43 AM    Lab Results   Component Value Date/Time    BUN 15 09/22/2021 05:43 AM    CREATININE 1.03 09/22/2021 05:43 AM    GLUCOSENF 113 (H) 09/22/2021 05:43 AM      NT   Lab Results   Component Value Date    WBC 7.7 09/22/2021    HGB 14.7 09/22/2021    HCT 41.6 (L) 09/22/2021    PLTCNT 180 09/22/2021       DIFFERENTIAL  Lab Results   Component Value Date    PMNS 58 09/22/2021    LYMPHOCYTES 29 09/22/2021    MONOCYTES 12 09/22/2021    EOSINOPHIL 1 09/22/2021    BASOPHILS 1  09/22/2021    BASOPHILS 0.10 09/22/2021    PMNABS 4.40 09/22/2021    LYMPHSABS 2.20 09/22/2021    EOSABS 0.10 09/22/2021    MONOSABS 0.90 09/22/2021        Hepatic Function:     Hepatic Function    Lab Results   Component Value Date/Time    ALBUMIN 4.4 09/21/2021 05:00 PM    TOTALPROTEIN 7.4 09/21/2021 05:00 PM    ALKPHOS 45 09/21/2021 05:00 PM    PROTHROMTME 12.2 09/21/2021 05:00 PM    INR 1.05 09/21/2021 05:00 PM    Lab Results   Component Value Date/Time    AST 22 09/21/2021 05:00 PM    ALT 22 09/21/2021 05:00 PM  Magnesium: MAGNESIUM  Lab Results   Component Value Date    MAGNESIUM 2.2 09/22/2021        PT: COAGULATION TESTS  Lab Results   Component Value Date    PROTHROMTME 12.2 09/21/2021    APTT 29.9 09/21/2021    INR 1.05 09/21/2021    HCT 41.6 (L) 09/22/2021    PLTCNT 180 09/22/2021     BNP  Lab Results   Component Value Date    BNP 56 09/21/2021        TROPONIN I  Lab Results   Component Value Date    TROPONINI 28 (H) 09/21/2021    TROPONINI 25 (H) 09/21/2021    TROPONINI 20 (H) 09/21/2021   Lipid Panel:    Recent Labs     09/22/21  0543   CHOLESTEROL 135   HDLCHOL 38   LDLCHOL 70   TRIG 134   VLDLCAL 27       CVIS TESTING:  Most Recent EKG This Encounter   ECG 12 LEAD    Collection Time: 09/21/21  5:02 PM   Result Value    Ventricular rate 100    Atrial Rate 94    PR Interval 158    QRS Duration 74    QT Interval 348    QTC Calculation 448    Calculated P Axis 20    Calculated R Axis 98    Calculated T Axis -14    Narrative    Sinus rhythm with premature supraventricular complexes  Rightward axis  Poor R-wave progression  Nonspecific T wave abnormality  Abnormal ECG  When compared with ECG of 21-Sep-2021 16:48,  premature supraventricular complexes are now present  Vent. rate has decreased BY  77 BPM  QRS axis shifted left  Confirmed by Kalliopi Coupland ([id number]) on 09/22/2021 9:01:05 AM      Last Stress Result   MYOCARDIAL PERFUSION COMPLETE    Narrative    Resting EKG revealed:  Normal sinus rhythm.       Results of for Lexiscan:  Patient experienced shortness of breath after   Lexiscan infusion.  Hemodynamic response was appropriate.  No significant   arrhythmias or diagnostic EKG ST segment depression was seen.    Myocardial perfusion SPECT study:  No significant perfusion defects were   seen during stress imaging . During resting imaging there appeared to be   hypoperfusion of left ventricular inferior wall which was most likely due   diaphragmatic attenuation.  No significant improvement of perfusion   pattern seen between stress and resting images.  LV cavity size was   normal.  No segmental wall motion abnormality was seen.  TID ratio was   normal at 1.18.  Calculated left ventricular ejection fraction was normal.    Impression:  1. Myocardial perfusion SPECT study with Lexiscan stress revealed no   significant reversibility or ischemia.    2. There was no infarct and LV wall motion, contractility, thickening,   cavity size and ejection fraction were normal.      IMAGING:  CT ANGIO CHEST W IV CONTRAST   Final Result   Negative evaluation for acute pulmonary embolism      Clear lungs         One or more dose reduction techniques were used (e.g., Automated exposure control, adjustment of the mA and/or kV according to patient size, use of iterative reconstruction technique).         Radiologist location ID: [id number]  XR AP MOBILE CHEST   Final Result   NO ACUTE FINDINGS.         Radiologist location ID: [id number]            Results for orders placed during the hospital encounter of 09/21/21    CT ANGIO CHEST W IV CONTRAST    Narrative  Collin Hall    RADIOLOGIST: Lucien Mons    CT ANGIO CHEST W IV CONTRAST performed on 09/21/2021 6:49 PM    CLINICAL HISTORY: Tachycardia.  Tachycardia; Shortness of Breath; Dizziness    TECHNIQUE: CTA imaging of the chest with intravenous contrast.  3D reconstructions.  CONTRAST:  75 ml's of Omnipaque 350    COMPARISON: Chest CT dated 02/04/2017.  # of known  CTs in the past 12 months: 0  # of known Cardiac Nuclear Medicine Studies in the past 12 months: 0    FINDINGS:  Hardware:  None.    Lymph nodes:   Calcified and noncalcified mediastinal lymph nodes are not suspiciously enlarged.    Heart:  Normal heart size. Mild coronary artery calcified atherosclerosis. Normal pericardium.  RV/LV Diameter Ratio: N/A    Thoracic Aorta:  No thoracic aortic aneurysm or dissection.    Pulmonary Vessels:  No evidence of acute pulmonary emboli through the major subsegmental branches.    Lungs and Airways:  The lungs are normally expanded and clear.    Pleura: No pleural effusion.  No pneumothorax.    Upper Abdomen: Visualized portions of the upper abdominal viscera are unremarkable.    Bones: Bone windows are unremarkable.    Impression  Negative evaluation for acute pulmonary embolism    Clear lungs      One or more dose reduction techniques were used (e.g., Automated exposure control, adjustment of the mA and/or kV according to patient size, use of iterative reconstruction technique).      Radiologist location ID: [id number]   DNR Status:  Full Code    Assessment  Active Hospital Problems    Diagnosis   . Primary Problem: Tachyarrhythmia       Chest pains Due to  TACHYCARDIA.  HISTORY OF ATRIAL FIBRILLATION AND TACHYCARDIA  PREMATURE SUPRAVENTRICULAR CONTRACTIONS.  HYPERTENSION, HYPERLIPIDEMIA AND HYPOTHYROIDISM    Plan and recommendations:      Patient's cardiac stress test was basically unremarkable and no inducible ischemia seen and his left ventricular ejection fraction was also normal.  No valvular stenosis seen.  So far monitoring revealed only PACs.  Patient has been placed on Toprol .  Continue Toprol and continue carvedilol and he can be discharged home for outpatient follow-up.  For now, he will continue aspirin.  I recommendation is to 7 or 14 days Holter recording followed by cardiac follow-up.    Thank you very much for this consultation.    Glorianne Manchester MD [facility]    18:10 09/22/2021      This note was partially generated using MModal Fluency Direct system, and there may be some incorrect words, spellings, and punctuation that were not noted in checking the note before saving.

## 2021-09-22 NOTE — Care Plan (Signed)
Pt admitted with tachyarrhythmia. Pt had stress test and echo today. HR in normal range. Continuous telemetry and pulse oximetry monitoring in place. Labs and vitals continue to be monitored per provider order. Safety protocols in place. Pt updated on treatment plan.   Problem: Adult Inpatient Plan of Care  Goal: Plan of Care Review  Outcome: Ongoing (see interventions/notes)  Goal: Patient-Specific Goal (Individualized)  Outcome: Ongoing (see interventions/notes)  Flowsheets (Taken 09/22/2021 0800)  Individualized Care Needs: Stress test today and echo.  Anxieties, Fears or Concerns: Worried about stress test today.  Patient-Specific Goals (Include Timeframe): Know results of tests by evening.  Plan of Care Reviewed With: patient  Goal: Absence of Hospital-Acquired Illness or Injury  Outcome: Ongoing (see interventions/notes)  Intervention: Identify and Manage Fall Risk  Recent Flowsheet Documentation  Taken 09/22/2021 0800 by Ernie Avena, RN  Safety Promotion/Fall Prevention: fall prevention program maintained  Intervention: Prevent Skin Injury  Recent Flowsheet Documentation  Taken 09/22/2021 0800 by Ernie Avena, RN  Body Position: positioned/repositioned independently  Skin Protection:   adhesive use limited   tubing/devices free from skin contact  Intervention: Prevent and Manage VTE (Venous Thromboembolism) Risk  Recent Flowsheet Documentation  Taken 09/22/2021 0800 by Ernie Avena, RN  VTE Prevention/Management: anticoagulant therapy maintained  Goal: Optimal Comfort and Wellbeing  Outcome: Ongoing (see interventions/notes)  Intervention: Provide Person-Centered Care  Recent Flowsheet Documentation  Taken 09/22/2021 0800 by Ernie Avena, RN  Trust Relationship/Rapport:   care explained   choices provided   emotional support provided   empathic listening provided   questions encouraged   reassurance provided   questions answered   thoughts/feelings acknowledged  Goal: Rounds/Family Conference  Outcome: Ongoing  (see interventions/notes)

## 2021-09-23 ENCOUNTER — Inpatient Hospital Stay (HOSPITAL_COMMUNITY)
Admission: RE | Admit: 2021-09-23 | Discharge: 2021-09-23 | Disposition: A | Discharge status: Home / Self Care | Payer: Medicare Other | Source: Ambulatory Visit | Attending: Internal Medicine | Admitting: Internal Medicine

## 2021-09-23 ENCOUNTER — Other Ambulatory Visit: Payer: Self-pay

## 2021-09-23 DIAGNOSIS — I10 Essential (primary) hypertension: Secondary | ICD-10-CM | POA: Insufficient documentation

## 2021-09-23 LAB — ECG 12 LEAD
Atrial Rate: 54 {beats}/min
Calculated P Axis: 34 degrees
Calculated R Axis: 13 degrees
Calculated T Axis: 4 degrees
PR Interval: 154 ms
QRS Duration: 76 ms
QT Interval: 454 ms
QTC Calculation: 430 ms
Ventricular rate: 54 {beats}/min

## 2021-09-23 LAB — CBC WITH DIFF
BASOPHIL #: 0.1 10*3/uL (ref 0.00–0.30)
BASOPHIL %: 1 % (ref 0–3)
EOSINOPHIL #: 0.1 10*3/uL (ref 0.00–0.80)
EOSINOPHIL %: 1 % (ref 0–7)
HCT: 43.9 % (ref 42.0–51.0)
HGB: 15.5 g/dL (ref 13.5–18.0)
LYMPHOCYTE #: 2.5 10*3/uL (ref 1.10–5.00)
LYMPHOCYTE %: 28 % (ref 25–45)
MCH: 32 pg (ref 27.0–32.0)
MCHC: 35.2 g/dL (ref 32.0–36.0)
MCV: 90.9 fL (ref 78.0–99.0)
MONOCYTE #: 1 10*3/uL (ref 0.00–1.30)
MONOCYTE %: 11 % (ref 0–12)
MPV: 8.4 fL (ref 7.4–10.4)
NEUTROPHIL #: 5.2 10*3/uL (ref 1.80–8.40)
NEUTROPHIL %: 59 % (ref 40–76)
PLATELETS: 187 10*3/uL (ref 140–440)
RBC: 4.83 10*6/uL (ref 4.20–6.00)
RDW: 13.3 % (ref 11.6–14.8)
WBC: 8.8 10*3/uL (ref 4.0–10.5)
WBCS UNCORRECTED: 8.8 10*3/uL

## 2021-09-23 LAB — BASIC METABOLIC PANEL
ANION GAP: 9 mmol/L — ABNORMAL LOW (ref 10–20)
BUN/CREA RATIO: 15 (ref 6–22)
BUN: 16 mg/dL (ref 7–25)
CALCIUM: 8.9 mg/dL (ref 8.6–10.3)
CHLORIDE: 106 mmol/L (ref 98–107)
CO2 TOTAL: 25 mmol/L (ref 21–31)
CREATININE: 1.08 mg/dL (ref 0.60–1.30)
ESTIMATED GFR: 76 mL/min/{1.73_m2} (ref >59)
GLUCOSE: 107 mg/dL (ref 74–109)
OSMOLALITY, CALCULATED: 281 mOsm/kg (ref 270–290)
POTASSIUM: 3.7 mmol/L (ref 3.5–5.1)
SODIUM: 140 mmol/L (ref 136–145)

## 2021-09-23 MED ORDER — METOPROLOL SUCCINATE ER 50 MG TABLET,EXTENDED RELEASE 24 HR
50.0000 mg | ORAL_TABLET | Freq: Every day | ORAL | 0 refills | Status: AC
Start: 2021-09-24 — End: 2021-10-24

## 2021-09-23 NOTE — Discharge Instructions (Signed)
HALTER  MONITOR  WILL  BE  APPLIED  JUST  AFTER  DISCHARGE  TO  MONITOR  YOUR  HEART  RHYTHM  FOR  7 DAYS,  CARDOLOGY  DEPT.  STAFF  WILL  INSTRUCT YOU  ON  THE  USE  OF  IT  WHEN  IT  IS  PUT  ON  AND  WHAT  TO  DO  AT  THE  END  OF  THE  7 DAYS.

## 2021-09-23 NOTE — Discharge Summary (Signed)
[facility]     DISCHARGE SUMMARY      PATIENT NAME:  Collin Hall   MRN:  [id number]  DOB:  08-Jul-1955    INPATIENT ADMISSION DATE: 09/21/2021   DATE OF DISCHARGE:  09/23/21     ATTENDING PHYSICIAN: Curt Jews, MD     PRIMARY CARE PHYSICIAN: Edwena Blow, FNP     DISCHARGE MEDICATIONS:     Current Discharge Medication List      START taking these medications.      Details   metoprolol succinate 50 mg Tablet Sustained Release 24 hr  Commonly known as: TOPROL-XL  Start taking on: September 24, 2021   50 mg, Oral, DAILY  Qty: 30 Tablet  Refills: 0        CONTINUE these medications - NO CHANGES were made during your visit.      Details   Bayer Low Dose Aspirin 81 mg Tablet, Delayed Release (E.C.)  Generic drug: aspirin   81 mg, Oral, DAILY  Refills: 0     cholecalciferol (vitamin D3) 25 mcg (1,000 unit) Tablet   1,000 Units, Oral, DAILY  Refills: 0     cyanocobalamin 500 mcg Tablet  Commonly known as: VITAMIN B-12   500 mcg, Oral, DAILY  Refills: 0     famotidine 40 mg Tablet  Commonly known as: PEPCID   40 mg, Oral, 2 TIMES DAILY  Refills: 0     levothyroxine 150 mcg Tablet  Commonly known as: SYNTHROID   150 mcg, Oral, EVERY MORNING  Refills: 0     Livalo 4 mg Tablet  Generic drug: Pitavastatin   Oral  Refills: 0     loratadine 10 mg Tablet  Commonly known as: CLARITIN   10 mg, Oral, DAILY  Refills: 0     losartan-hydrochlorothiazide 100-12.5 mg Tablet  Commonly known as: HYZAAR   1 Tablet, Oral, DAILY  Refills: 0     omeprazole 40 mg Capsule, Delayed Release(E.C.)  Commonly known as: PRILOSEC   40 mg, Oral, DAILY  Refills: 0        STOP taking these medications.    carvediloL 6.25 mg Tablet  Commonly known as: [place] PRESENTATION:    Please see full admission H&P for details.      As per HPI:  Collin Hall is a 66 y.o., White male who presents with heart palpitations and tachycardia for the last 2-3 weeks.  These episodes have been getting worse and worse, especially with  activity and he has had 3 today.  He says he feels like his heart is beating out of his chest and he can feel it beating in his neck and left shoulder.  This is associated with some chest discomfort and tightness along with shortness of breath with exertion.  He feels dizzy when these episodes start.  They do get better with rest.  He has a history of atrial fibrillation and was previously on Rythmol 150 mg p.o. b.i.d. he is to follow with Dr. Janne Lab, but stopped going to see him and stopped taking the Rythmol on his own.  He states that he did not understand what atrial fibrillation was and does not like taking medicines.  He had a heart catheterization by Dr. Briant Sites in [facility] in May of 2017 that showed no coronary disease.  On arrival here today he was in SVT with a rate in the 170s.  He was given metoprolol IV and p.o. by Dr. Aileen Pilot sick and is now in a sinus rhythm with occasional sinus arrhythmia.    FURTHER HOSPITAL COURSE WITH DISCHARGE DIAGNOSES:    66 y.o.,[name] presentedwithheart palpitations and tachycardia for the last 2-3 weeks. These episodes have been getting worse and worse, especially with activity He says he feels like his heart is beating out of his chest and he can feel it beating in his neck and left shoulder. This is associated with some chest discomfort and tightness along with shortness of breath with exertion. He feels dizzy when these episodes start. They do get better with rest. He has a history of atrial fibrillation and was previously on Rythmol 150 mg p.o. b.i.d. he is to follow with Dr. Alan Ripper Rana,but stopped going to see him and stopped taking the Rythmol on his own. On arrival here he was in SVT with a rate in the 170s. He was given metoprolol IV and p.o. by Dr. Aileen Pilot.Patient's CT angio was negative for PE    1.Tachyarrhythmia:Continue metoprolol.  echocardiogram showed good ejection fraction without wall motion abnormality.  Stress test was  negative.   cardiology recommended to discharge on Toprol follow-up outpatient. Patient's troponin is slightly elevated but flat a likely due to demand.  2.Previous history of atrial fibrillation.  3.Hypertension: Continue Hyzaar  4.Hypothyroidism. Continue Synthroid  5.Hyperlipidemia. Continue statin.   6.Continue patient's remaining medications from home              PHYSICAL EXAM   DAY OF DISCHARGE: Please see the Progress note from the same date    BP (!) 144/81   Pulse 62   Temp 36.6 C (97.8 F)   Resp 20   Ht 1.753 m ('5\' 9"' )   Wt 113 kg (250 lb)   SpO2 93%   BMI 36.92 kg/m          Psych:  Cooperative, not agitated    LABS:  CBC with Diff (Last 48 Hours):    Recent Results in last 48 hours     09/21/21  1700 09/22/21  0543 09/23/21  0602   WBC 10.9* 7.7 8.8   HGB 17.0 14.7 15.5   HCT 48.6 41.6* 43.9   MCV 90.8 90.5 90.9   PLTCNT 230 180 187   PMNS 63 58 59   LYMPHOCYTES 23* 29 28   MONOCYTES '12 12 11   ' EOSINOPHIL '1 1 1   ' BASOPHILS 1  0.10 1  0.10 1  0.10          Last BMP  (Last result in the past 48 hours)      Na   K   Cl   CO2   BUN   Cr   Calcium   Glucose   Glucose-Fasting        09/23/21 0602 140   3.7   106   25   16   1.08   8.9   107           Last Hepatic Panel  (Last result in the past 48 hours)      Albumin   Total PTN   Total Bili   Direct Bili   Ast/SGOT   Alt/SGPT   Alk Phos        09/21/21 1700 4.4   7.4   0.'7     22   22   ' 45  BMP (Last 48 Hours):    Recent Results in last 48 hours     09/21/21  1700 09/22/21  0543 09/23/21  0602   SODIUM 137 139 140   POTASSIUM 3.6 3.9 3.7   CHLORIDE 103 107 106   CO2 '23 28 25   '$ BUN '16 15 16   '$ CREATININE 1.38* 1.03 1.08   CALCIUM 9.5 8.5* 8.9   GLUCOSENF 174* 113* 107          IMAGING:             DISCHARGE DISPOSITION:  Home / Self Care    DISCHARGE INSTRUCTIONS:   Follow up with PCP in one week.       >30 minutes total were spent coordinating discharge day today      Curt Jews, MD  [place] HOSPITALIST

## 2021-09-23 NOTE — Progress Notes (Signed)
[facility]   Medicine Progress Note    Collin Hall  Date of service: 09/23/2021  Date of Admission:  09/21/2021    Hospital Day:  LOS: 2 days     A/P:   66 y.o.,[name] presented withheart palpitations and tachycardia for the last 2-3 weeks. These episodes have been getting worse and worse, especially with activity  He says he feels like his heart is beating out of his chest and he can feel it beating in his neck and left shoulder. This is associated with some chest discomfort and tightness along with shortness of breath with exertion. He feels dizzy when these episodes start. They do get better with rest. He has a history of atrial fibrillation and was previously on Rythmol 150 mg p.o. b.i.d. he is to follow with Dr. Cloretta Ned Rana,but stopped going to see him and stopped taking the Rythmol on his own. On arrival here he was in SVT with a rate in the 170s. He was given metoprolol IV and p.o. by Dr. Gay Filler.  Patient's CT angio was negative for PE    1. Tachyarrhythmia:  Continue metoprolol.   echocardiogram showed good ejection fraction without wall motion abnormality.  Stress test was negative.  Awaiting Cardiology input.  Patient's troponin is slightly elevated but flat a likely due to demand.  2. Previous history of atrial fibrillation.   3. Hypertension:  Continue Hyzaar  4. Hypothyroidism.  Continue Synthroid  5. Hyperlipidemia.  Continue statin.   6.  Continue patient's remaining medications from home    Subjective:     Denies any palpitation.  He would like to go home today    Vital Signs:  Temp  Avg: 36.7 C (98 F)  Min: 36.4 C (97.6 F)  Max: 37.1 C (98.7 F)    Pulse  Avg: 58.1  Min: 51  Max: 62 BP  Min: 118/74  Max: 155/56   Resp  Avg: 18.8  Min: 18  Max: 20 SpO2  Avg: 93 %  Min: 90 %  Max: 96 %          Physical Exam:  Constitutional: no distress  Respiratory: Clear to auscultation bilaterally.   Cardiovascular: regular rate and rhythm  Gastrointestinal: Soft,  non-tender, Bowel sounds normal  Musculoskeletal: No edema of lower extremities  Neuro:     Today's Labs and medication Reviewed.         Collin Litten, MD

## 2021-09-26 LAB — ECG 12 LEAD
Atrial Rate: 56 {beats}/min
Calculated P Axis: 53 degrees
Calculated R Axis: 11 degrees
Calculated T Axis: 16 degrees
PR Interval: 174 ms
QRS Duration: 78 ms
QT Interval: 440 ms
QTC Calculation: 424 ms
Ventricular rate: 56 {beats}/min

## 2021-10-11 LAB — 7 DAY EXTENDED HOLTER MONITOR
Atrial fibrillation - heart rate (average): 97 {beats}/min
Atrial fibrillation burden: 6 %
Atrial fibrillation with fastest heart rate - heart rate (a: 95 {beats}/min
Atrial fibrillation with slowest heart rate - heart rate (a: 95 {beats}/min
Heart rate (average): 63 {beats}/min
Isolated SVE count: 52803 episodes
Isolated VE Counts: 4752 episodes
Longest atrial fibrillation episode - duration: 23651.9 s
Longest atrial fibrillation episode - heart rate (average): 95 {beats}/min
SVE Couplets Counts: 1490 episodes
SVE Triplets Counts: 153 episodes

## 2022-02-02 ENCOUNTER — Other Ambulatory Visit (HOSPITAL_COMMUNITY): Payer: Self-pay | Admitting: Family

## 2022-02-02 DIAGNOSIS — K760 Fatty (change of) liver, not elsewhere classified: Secondary | ICD-10-CM

## 2022-07-07 ENCOUNTER — Inpatient Hospital Stay
Admission: RE | Admit: 2022-07-07 | Discharge: 2022-07-07 | Disposition: A | Discharge status: Home / Self Care | Payer: Medicare Other | Source: Ambulatory Visit | Attending: Family | Admitting: Family

## 2022-07-07 ENCOUNTER — Other Ambulatory Visit: Payer: Self-pay

## 2022-07-07 DIAGNOSIS — K760 Fatty (change of) liver, not elsewhere classified: Secondary | ICD-10-CM | POA: Insufficient documentation

## 2022-07-22 IMAGING — MR MRI LUMBAR SPINE WITHOUT CONTRAST
5 of 6 series · 31 of 48 positions shown · IV contrast (gadolinium)
Comparison: No prior imaging studies are available for comparison.

﻿EXAM:  [id number]   MRI LUMBAR SPINE WITHOUT CONTRAST
INDICATION: 67-year-old with low back pain and bilateral lower extremity pain.  Patient had multiple back procedures for pain relief.  No history of open surgery of the spine.
TECHNIQUE: Multiplanar, multisequential MRI of the lumbosacral spine was performed without gadolinium contrast.

[Series 6: T1 · sagittal · 4.0mm · 0.94mm/px · 7 of 13 slices shown (1 of 2)]
[im 1/13]
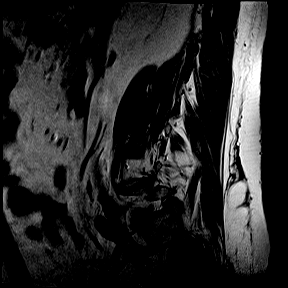
[im 3/13]
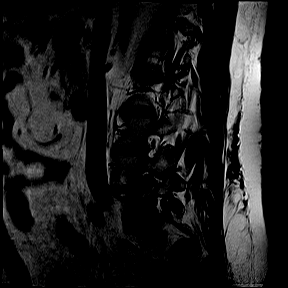
[im 5/13]
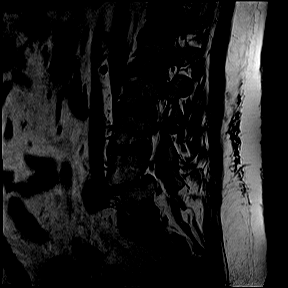
[im 7/13]
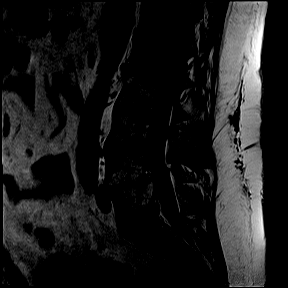
[im 9/13]
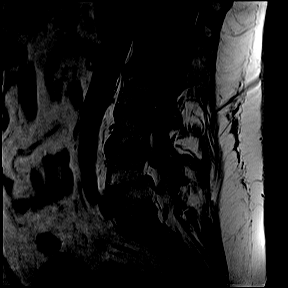
[im 11/13]
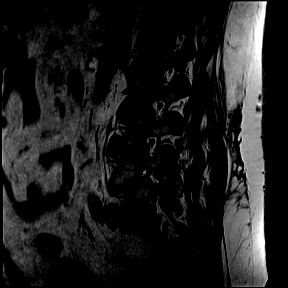
[im 13/13]
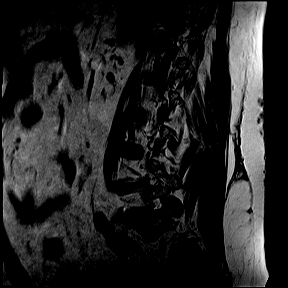

[Series 7: T2 · sagittal · 4.0mm · 0.94mm/px · 6 of 13 slices shown (1 of 3)]
[im 1/13]
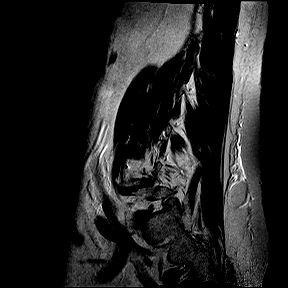
[im 3/13]
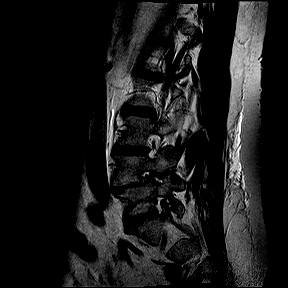
[im 5/13]
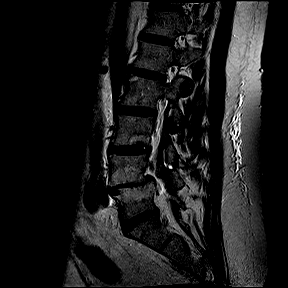
[im 8/13]
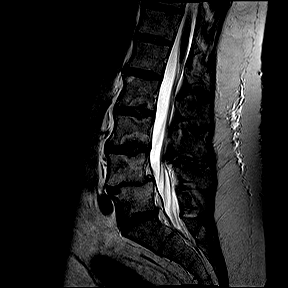
[im 10/13]
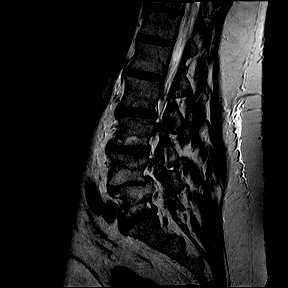
[im 13/13]
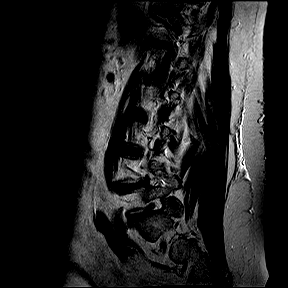

[Series 9: T2 · coronal · 5.0mm · 0.82mm/px · 9 of 18 slices shown (2 of 3)]
[im 1/18]
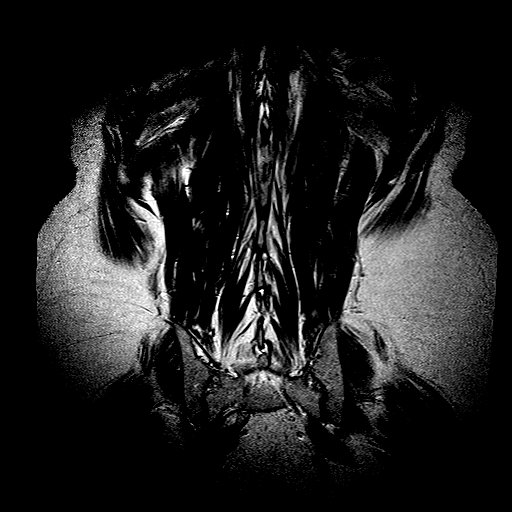
[im 3/18]
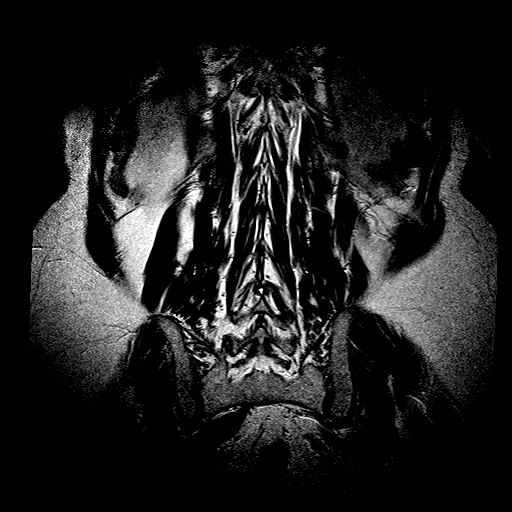
[im 5/18]
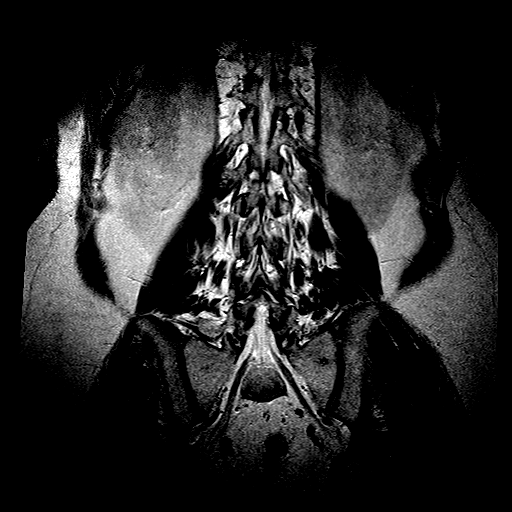
[im 7/18]
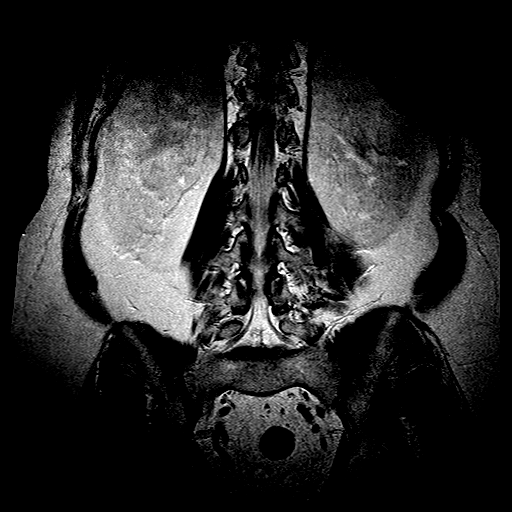
[im 9/18]
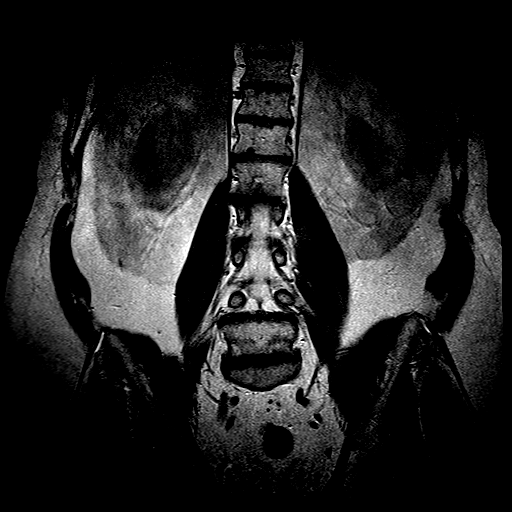
[im 11/18]
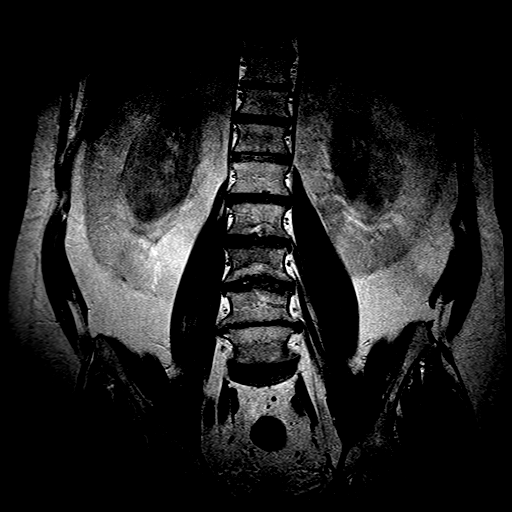
[im 13/18]
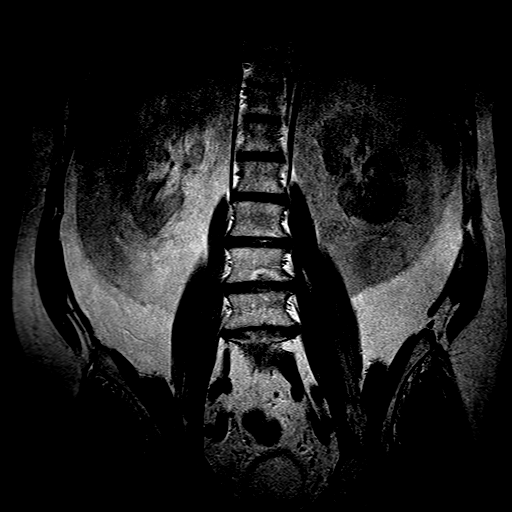
[im 15/18]
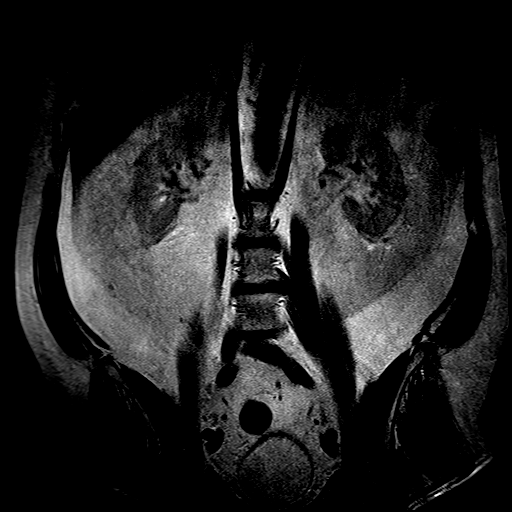
[im 18/18]
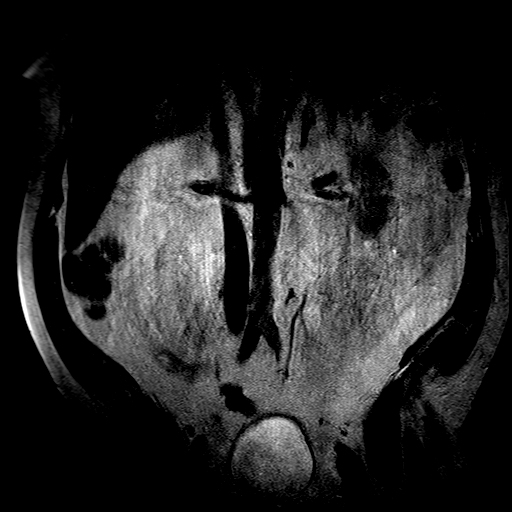

[Series 10: T2 · axial · 4.0mm · 0.52mm/px · z∈[-120,+137]mm · 8 of 21 slices shown (3 of 3)]
[im 1/21]
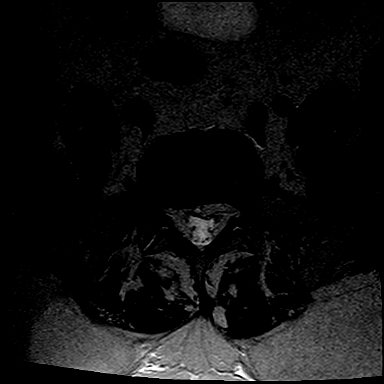
[im 3/21]
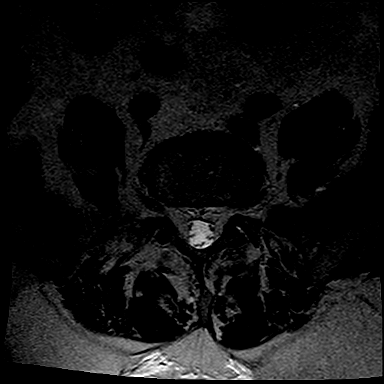
[im 7/21]
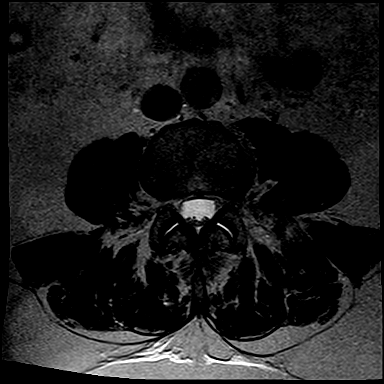
[im 9/21]
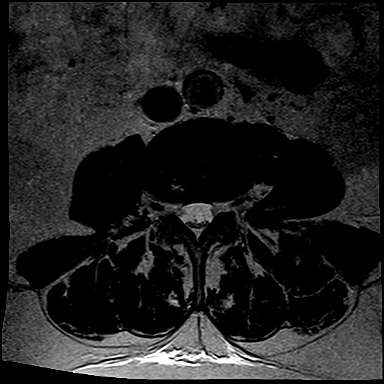
[im 12/21]
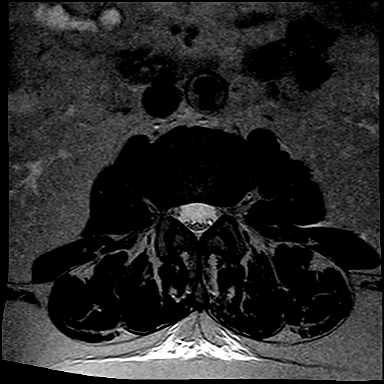
[im 14/21]
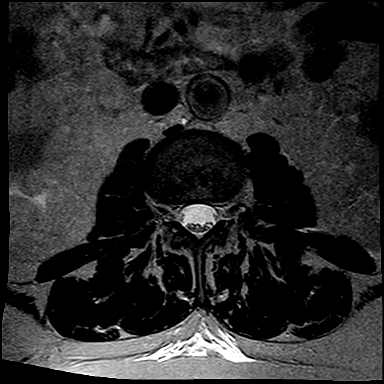
[im 18/21]
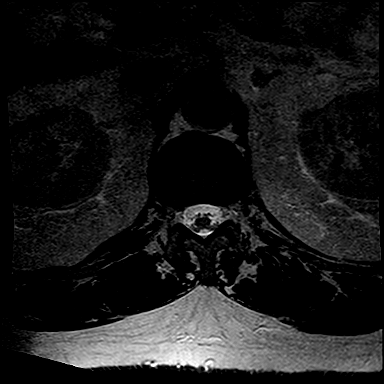
[im 21/21]
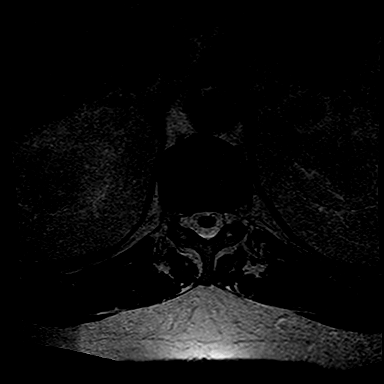

[Series 11: T1 · axial · 4.0mm · 0.52mm/px · 1 of 21 slices shown (2 of 2)]
[im 1/21]
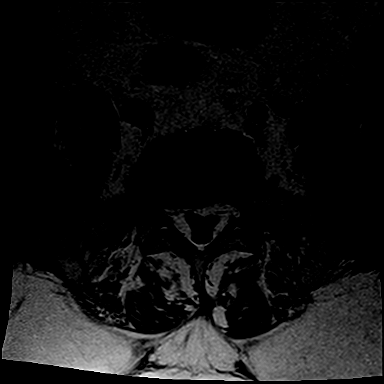

[31 of 48 positions shown; findings below may reference images not displayed]

FINDINGS: No acute bony lesions of lumbar vertebrae are seen.  Lower spinal cord and cauda equina are normal. 

At L1-2 level, bilateral facet arthropathy is noted without significant compromise of thecal sac or neural foramina.  Similar finding also noted at L2-3 level. 

At L3-4 level, bilateral facet arthropathy of moderate degree and degenerative disc disease with bulging annulus are noted causing mild compromise of both neural foramina.

At L4-5 level, severe degenerative disc disease with complete loss of disc space is noted with first-degree spondylolisthesis.  The degenerative disc changes and facet arthropathy are causing severe biforaminal narrowing at L4-5 level, right worse than the left impinging on the L4 nerve roots on both sides.

At L5-S1 level, degenerative disc changes and facet arthropathy are causing mild compromise of both neural foramina. 

Paravertebral soft tissues show mild ectasia of abdominal aorta with AP diameter in the mid abdomen measuring 21 mm.
IMPRESSION: 1. No acute bony lesions of lumbar vertebrae. 

2. At L4-5 level, severe degenerative disc disease with complete loss of disc space is noted with first-degree spondylolisthesis.  The degenerative disc changes and facet arthropathy are causing severe biforaminal narrowing at L4-5 level, right worse than the left impinging on the L4 nerve roots on both sides.

3. Findings at other disc levels are described above in detail.

4. Mild ectasia of abdominal aorta measuring maximum AP diameter of 21 mm. Annual follow-up with ultrasound of the aorta is suggested.

## 2023-02-02 ENCOUNTER — Other Ambulatory Visit (HOSPITAL_COMMUNITY): Payer: Self-pay | Admitting: Family

## 2023-02-02 DIAGNOSIS — K760 Fatty (change of) liver, not elsewhere classified: Secondary | ICD-10-CM

## 2023-03-03 IMAGING — MR MRI PELVIS WITHOUT CONTRAST
4 of 7 series · 24 of 48 positions shown · non-contrast
Comparison: None available.

﻿EXAM:  [id number]   MRI PELVIS WITHOUT CONTRAST
INDICATION: Low back and bilateral leg pain.
TECHNIQUE: Noncontrast multiplanar, multisequence MRI was performed.

[Series 7: T1 · axial · 7.0mm · 0.86mm/px · z∈[-95,+122]mm · 8 of 30 slices shown (1 of 2)]
[im 1/30]
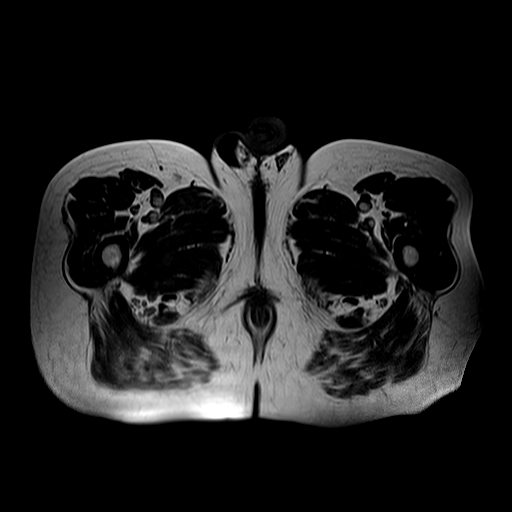
[im 5/30]
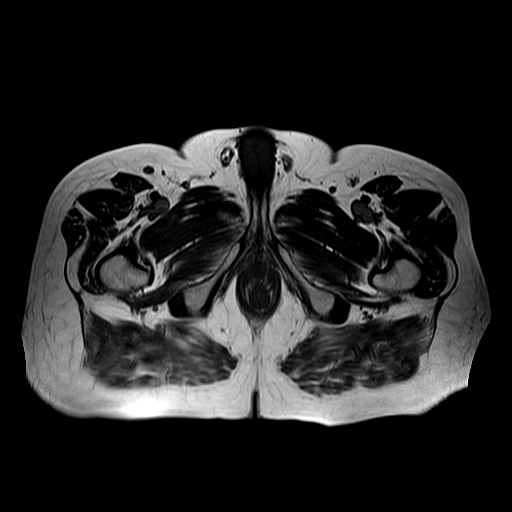
[im 9/30]
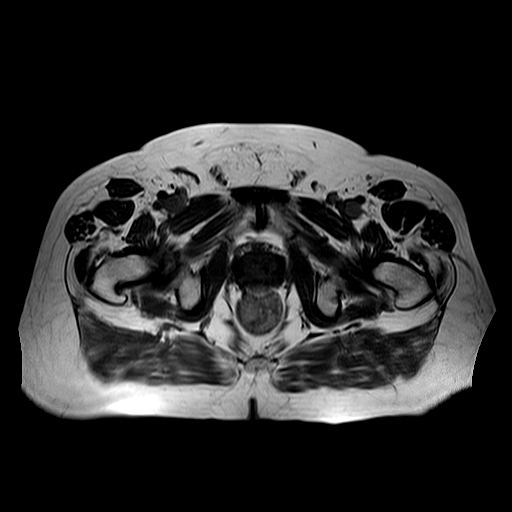
[im 13/30]
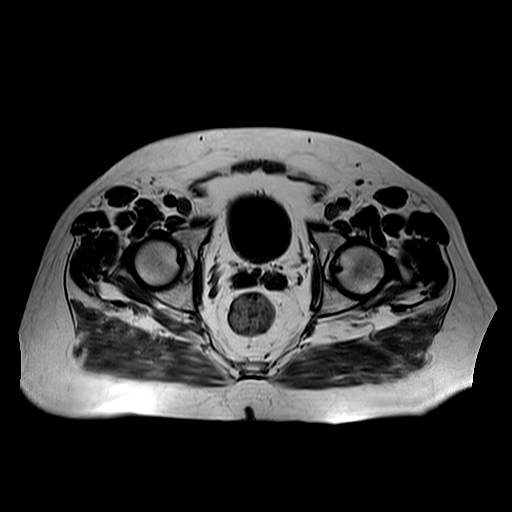
[im 17/30]
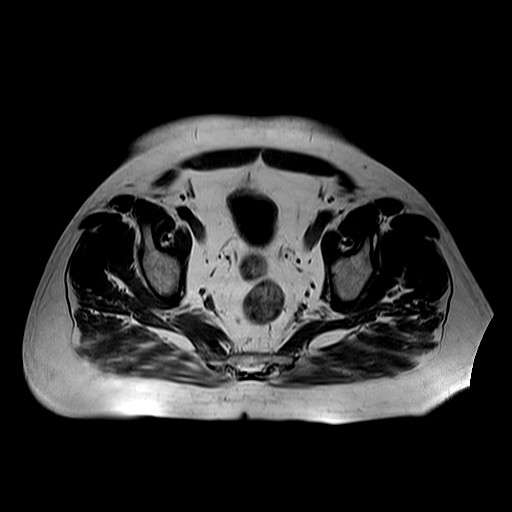
[im 21/30]
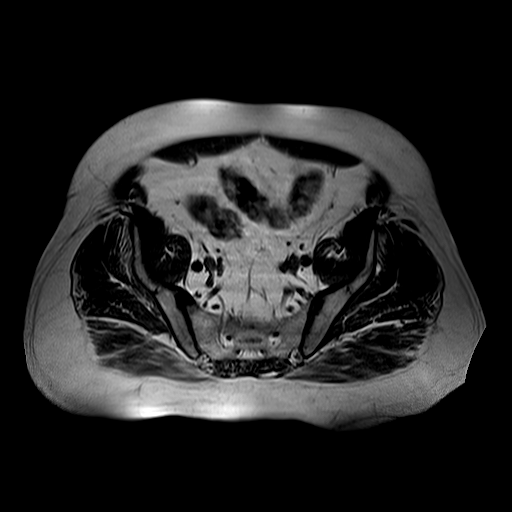
[im 25/30]
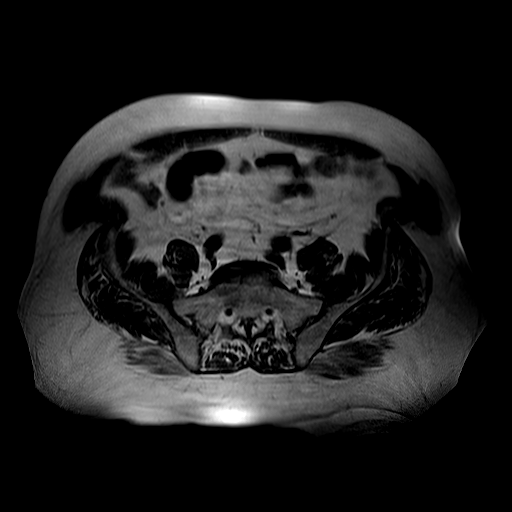
[im 30/30]
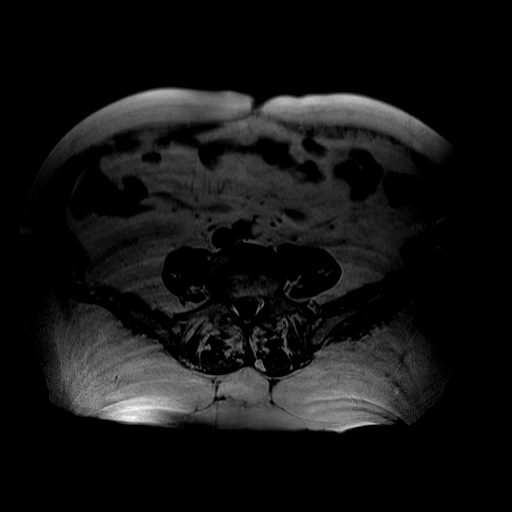

[Series 9: T2 fat-sat · axial · 7.0mm · 1.38mm/px · z∈[-95,+122]mm · 8 of 30 slices shown]
[im 1/30]
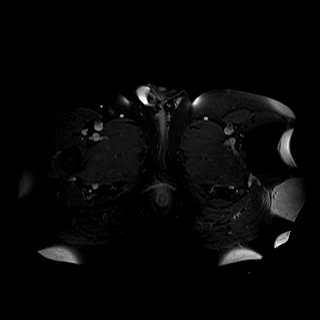
[im 5/30]
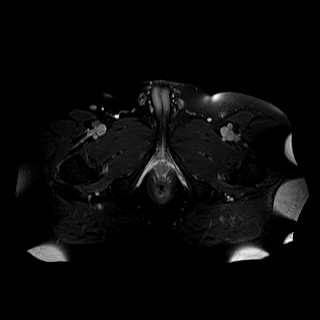
[im 9/30]
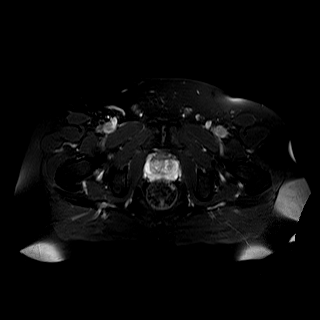
[im 13/30]
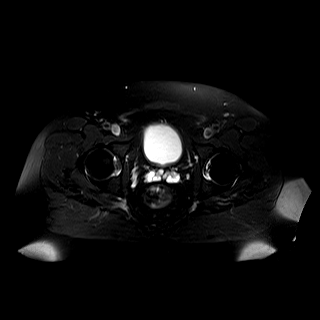
[im 17/30]
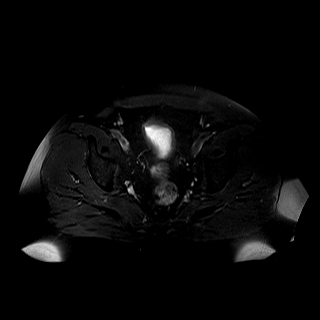
[im 21/30]
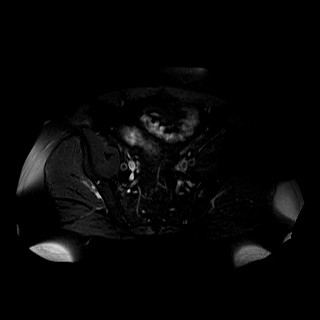
[im 25/30]
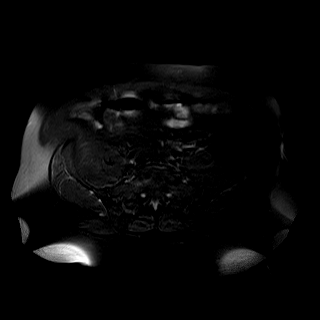
[im 30/30]
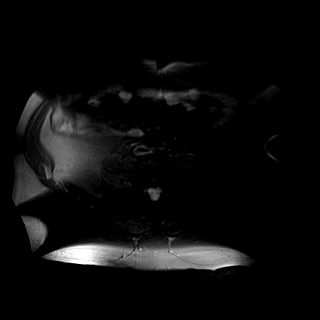

[Series 10: T1 · coronal · 5.0mm · 0.86mm/px · 5 of 28 slices shown (2 of 2)]
[im 1/28]
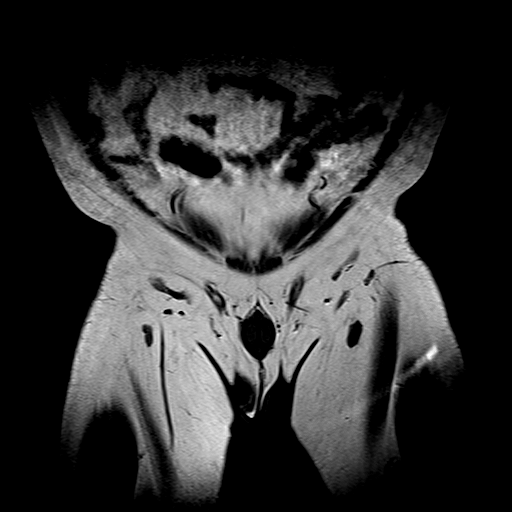
[im 5/28]
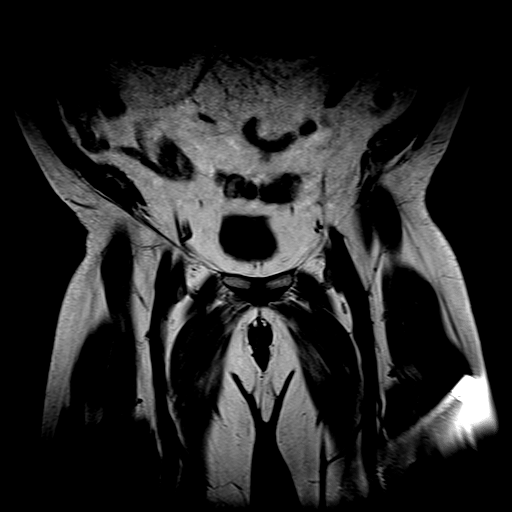
[im 10/28]
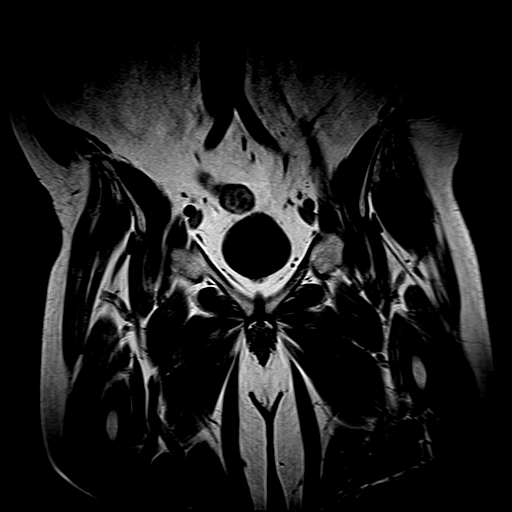
[im 14/28]
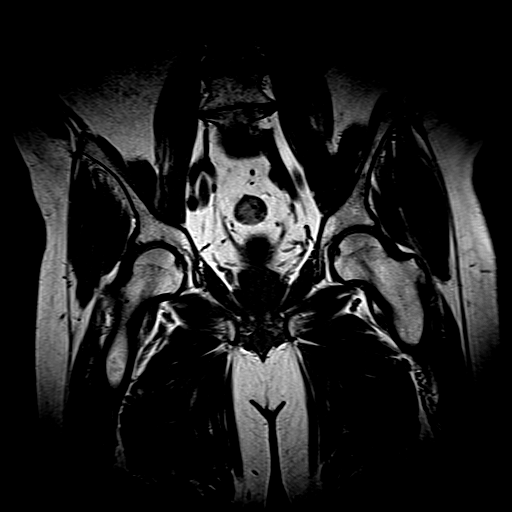
[im 23/28]
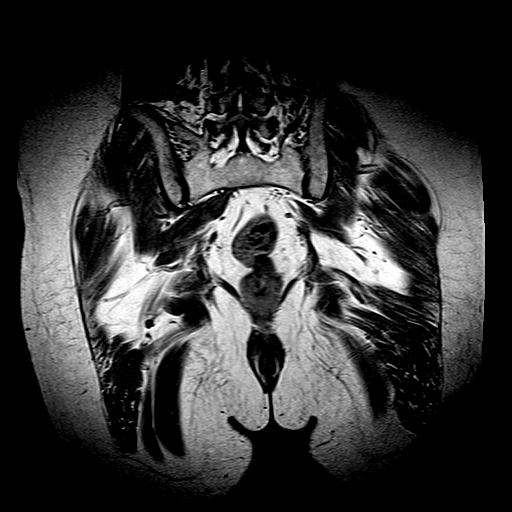

[Series 11: STIR · coronal · 5.0mm · 0.86mm/px · 3 of 28 slices shown]
[im 5/28]
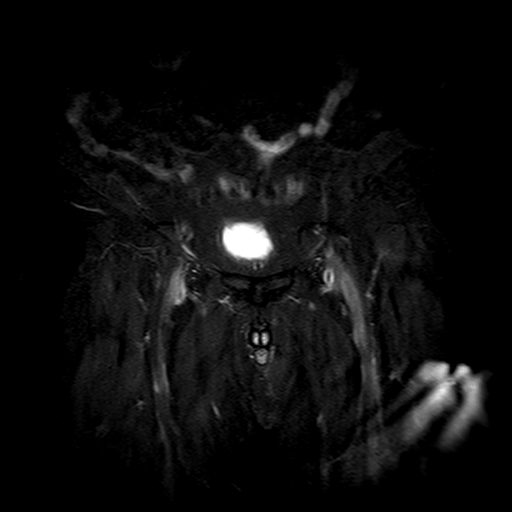
[im 14/28]
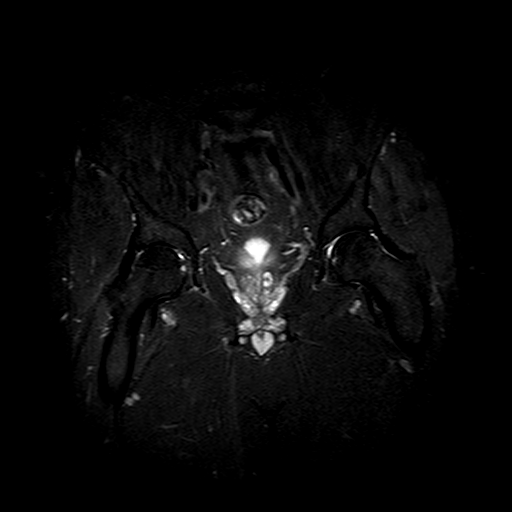
[im 23/28]
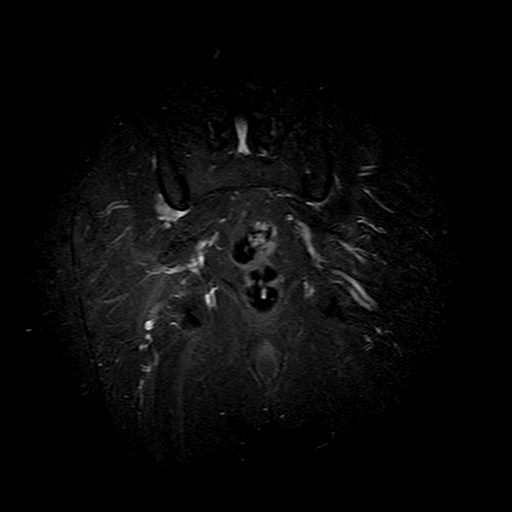

[24 of 48 positions shown; findings below may reference images not displayed]

FINDINGS: There are mild degenerative changes involving both hips.  

There is no fracture, dislocation, marrow signal alteration, or joint effusion.  There is no avascular necrosis or evidence of bursitis.  No muscle or tendon tear is seen.  There is no abnormal fluid collection.
IMPRESSION: Mild bilateral hip osteoarthritis.

## 2023-06-29 ENCOUNTER — Other Ambulatory Visit: Payer: Self-pay

## 2023-06-29 ENCOUNTER — Ambulatory Visit
Admission: RE | Admit: 2023-06-29 | Discharge: 2023-06-29 | Disposition: A | Discharge status: Home / Self Care | Payer: Self-pay | Source: Ambulatory Visit | Attending: Family | Admitting: Family

## 2023-06-29 DIAGNOSIS — K760 Fatty (change of) liver, not elsewhere classified: Secondary | ICD-10-CM | POA: Insufficient documentation

## 2023-10-13 ENCOUNTER — Other Ambulatory Visit: Payer: Self-pay

## 2023-10-25 ENCOUNTER — Other Ambulatory Visit (HOSPITAL_COMMUNITY): Payer: Self-pay | Admitting: Orthopaedic Surgery

## 2023-10-25 DIAGNOSIS — M51369 Other intervertebral disc degeneration, lumbar region without mention of lumbar back pain or lower extremity pain: Secondary | ICD-10-CM

## 2023-10-26 ENCOUNTER — Ambulatory Visit
Admission: RE | Admit: 2023-10-26 | Discharge: 2023-10-26 | Disposition: A | Discharge status: Still In Hospital | Source: Ambulatory Visit

## 2023-10-26 ENCOUNTER — Encounter (HOSPITAL_COMMUNITY): Payer: Self-pay

## 2023-10-26 ENCOUNTER — Other Ambulatory Visit: Payer: Self-pay

## 2023-10-26 DIAGNOSIS — M51369 Other intervertebral disc degeneration, lumbar region without mention of lumbar back pain or lower extremity pain: Secondary | ICD-10-CM | POA: Insufficient documentation

## 2023-10-26 DIAGNOSIS — Z981 Arthrodesis status: Secondary | ICD-10-CM | POA: Insufficient documentation

## 2023-10-26 NOTE — PT Evaluation (Signed)
 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  He has less sciatica since surgery.  His LBP is about the same . He notes bilat LE weakness .  He reported severe pain the first one month post op.     PLOF: chronic and progressive LBP  ( 20 yr h/o  LBP) . He  had severe bilat sciatica pre op.    Previous episodes/treatments:   pt reports intolerance to previous OPT 3 weeks  post op ( pt attended one session only) . He was readmitted to hospital for 2d for pain control.     Past Medical History:   Past Medical History:   Diagnosis Date    Atrial fibrillation (CMS HCC)     Dyslipidemia     GERD (gastroesophageal reflux disease)     High cholesterol     HTN (hypertension)     Hypertriglyceridemia     Hypothyroidism     Osteoarthritis     Peptic ulcer     Renal calculi     Sensorineural hearing loss      Past Surgical History:   Past Surgical History:   Procedure Laterality Date    HAND SURGERY      HX CHOLECYSTECTOMY      HX ROTATOR CUFF REPAIR Bilateral      Spinal surgery    Medications for this problem: TYLENOL    Diagnostic tests: post op MRI  at [facility] , post op injections which did not help     Patient goals: REDUCE PAIN and NORMALIZE  FUNCTION    Occupation: DISABLED and FORMER COAL MINER, DISABLED WITH LBP SINCE 2010    Next MD visit: TBA    Pain location:  band like soreness across low back                     Pain description: SHARP, DULL, and soreness in back; tingling from waist down bilat Les  , no sciatica     Pain frequency:  INTERMITTENT and associated with WB activity     Pain rating: Now 5   Best 0   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 He does not describe radicular type pain  in LE     Pain increases with: ACTIVITY, SIT, STAIR CLIMBING, BENDING, and LIFTING           decreases with : REST and NWB     Sensation: PT REPORTS A TINGLING SENSATION IN BILAT LE BELOW KNEE LEVEL THAT IS CIRCUMFERENTIAL    Weakness: PT NOTES  WEAKNESS CLIMBING A LADDER  OR STEPS  OR WITH SIT TO STAND     Sleep affected: FREQUENT SLEEP DISRUPTION    Bowel/bladder problems:NO    Subjective Functional Reports:    Sitting: LIMITED APPROXIMATELY 60 MIN    Standing: LIMITED    Walking: LIMITED APPROXIMATELY 20 MIN     Lifting: AVOIDS HEAVY LIFTING           OBJECTIVE    Patient-Specific Functional Score:  Problem Score   1. Property maintenance  5   2. Walking longer distances  2   3. Climbing  steps  4   Total 11   Total score = sum of the activity scores/number of activities  Minimal detectable change (90% CI) for avg score = 2 points  Minimal detectable change (90% CI) for single activity score = 3 points  3.6       AROM   right left   Flexion 85 degrees , posterior leg tightness     Extension 10 degrees  No   recruitment below L2   Sidebend 15 degrees  15 degrees   Rotation 15 degrees  12 degrees   ROM comments :lateral flexion is limited by rib to pelvis approximation.      Walking tolerance = 452 feet before onset of below knee fatigue and posterior leg tightness.      Strength :      Min excursion with SLS heel raise on either LE      Knee flexion excursion with squat  = 70 degrees limited by  LE  weakness       R knee flexion excursion with SLS  squat = 70 degrees,  L knee = 50 degrees      Lowest seat height for sit to stand with no UE counterbalance = 17 inch  with max effort      Pt is able to do a supine SLR  right  and left and normal effort perceived  and no c/o lbp     Pt performs  supine  bridging with max effort and lumbar strain     Strength comments : The pt exhibits bilat calf atrophy     bed mobility : labored   and guarded  Palpation: non-tender     Joint mobility  bilat stiff hips ( 90 degrees flexion , 10 degrees IR , ER WFL, bilat hip flexion contractures  10 degrees    Posture: thoracolumbar scoliosis, level pelvic base ,  loss of   mid to lower lumbar lordosis and cervical lordosis. Pt stands with hips in ER  and has a high lumbar lordosis centered at L1      Gait: NO ASSISTIVE DEVICE, RECIPROCATING STEPS WITH SYMMETRIC STRIDE LENGTH, WIDE BOS, INITIATION OF GAIT WITHOUT HESITATION, and absent trunk rotation and reduced arm swing.     Reflexes: NT  Special tests:  negative FABER bilat , + thomas test bilat       Treatment provided:evaluation only               ASSESSMENT    Impression: the pt has decrease lower body strength, decrease hip /pelvic mobility , hip flexor contractures  and persistent LBP at 4 months post op lumbar fusion/disectomy. He exhibits altered body mechanics with all transitions and his tolerance to sustained ambulation is limited by progressive LE weakness.     Rehab potential: FAIR  Short-Term Goals: 3 Weeks   - Patient will demonstrate improved lumbar AROM  by 5 - 10 degrees in sagittal plane  - Patient will be independent with progressive HEP to maximize gains from PT.   - Patient will report max 8 SPS  for  LBP to aid in participation in PT.   - Patient will exhibit functional trunk mobility and strength to allow less labored bed mobility activities due to pain.      Long-Term Goals: 6  Weeks   - Patient will demonstrate improved calf and  quad  strength of at  least 4+/5 to aid in functional transfers and stair climbing.  - Patient will demonstrate  SLS  balance to 15 sec or more to aid in gait stability .    - Patient will demonstrate improved functional ability via improved Patient Specific Functional Score to at least 7.   - Patient will report sleep not disrupted by back pain to aid in participation of functional ADLs during the day.   - Patient will report  worst subjective pain <=5/10 to allow return to community ambulation not limited by pain.     PLAN  Patient will attend 2 times per week x 6 weeks. Therapy may include, but is not limited to THERAPEUTIC EXERCISES, MYOFASCIAL/JOINT MOBILIZATION, POSTURE/BODY MECHANICS, ERGONOMIC TRAINING, and HOME INSTRUCTIONS    Plan for next visit : start NWB hip flexibility exercises ; address hip flexor and extensor tightness.     Evaluation complexity:   Personal factors impacting POC: none   Co-morbidities impacting POC: CARDIAC DISEASE  Complexity of physical exam: INCLUDING MUSCULOSKELETAL SYSTEM (POSTURE, ROM, STRENGTH, HEIGHT/WEIGHT), INCLUDING NEUROMUSCULAR EXAM (BALANCE, GAIT, LOCOMOTION, MOBILITY), INCLUDING ACTIVITY/MOBILITY RESTRICTIONS, and REFERRAL IS FOR A CHRONIC PROBLEM   Clinical Presentation: EVOLVING WITH CHANGING CLINICAL CHARACTERISTICS   Evaluation Complexity: MODERATE-HISTORY 1-2, EXAMINATION 3+, PRESENTATION  EVOLVING/CHANGING    Total Session Time 49 and Timed code minutes 0       Intervention minutes: EVALUATION 49 minutes    Shawnee Moore, PT  10/26/2023,1045

## 2023-11-03 ENCOUNTER — Other Ambulatory Visit: Payer: Self-pay

## 2023-11-03 ENCOUNTER — Ambulatory Visit (HOSPITAL_COMMUNITY)
Admission: RE | Admit: 2023-11-03 | Discharge: 2023-11-03 | Disposition: A | Discharge status: Still In Hospital | Payer: Self-pay | Source: Ambulatory Visit

## 2023-11-03 NOTE — PT Treatment (Signed)
 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 He does not seem to be making progress with his strength.     Objective:  the pt was given an HEP today and advised to obtain a physioball for his HEP       EXERCISE/ACTIVITY NAME REPETITIONS RESISTANCE COMPLETED THIS DOS   Physioball   supine bridging knees slightly flexed                       Supine bridging with knees extended                       Supine LTR with abdominal contraction                       Supine resisted hip flexion single LE R/L  X6  X10   X10   X10 each  NA  NA  NA  Blue TB All added to HEP 7/17    PB supine SKT L/R      PB supine DKTC  X5  EACH  X5 na YES     Supine SLR left / right  X5 each  yes                                         DISCONTINUED ACTIVITIES                            Access Code: [email or web address]  URL: [email or web address]  Date: 11/03/2023  Prepared by: SHAWNEE MOORE    Exercises  - supine bridging with gluteal activation  - 1 x daily - 4 x weekly - 3 sets - 6-7 reps - 4s hold  - Supine Lower Trunk Rotation with Swiss Ball  - 1 x daily - 7 x weekly - 3 sets - 10 reps  - Supine Hip and Knee Flexion AROM with Swiss Ball  - 1 x daily - 4 x weekly - 3 sets - 8-10 reps  - Seated Hip Abduction with Resistance  - 1 x daily - 4 x weekly - 2-3 sets - 8-10  reps    Assessment:  the pt has bilat hip flexor weakness , right more than left. He was able to do NWB low level core and lower body strengthening without increasing his base line level of LBP. He  exhibits calf atrophy bilat. He reports a long h/o debilitating LBP prior to his surgery. 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     Rehab potential: FAIR  Short-Term Goals: 3 Weeks   -  Patient will demonstrate improved lumbar AROM  by 5 - 10 degrees in sagittal plane  - Patient will be independent with progressive HEP to maximize gains from PT.   - Patient will report max 8 SPS  for  LBP to aid in participation in PT.   - Patient will exhibit functional trunk mobility and strength to allow less labored bed mobility activities due to pain.       Long-Term Goals: 6  Weeks   - Patient will demonstrate improved calf and  quad  strength of at least 4+/5 to aid in functional transfers and stair climbing.  - Patient will demonstrate  SLS  balance to 15 sec or more to aid in gait stability .    - Patient will demonstrate improved functional ability via improved Patient Specific Functional Score to at least 7.   - Patient will report sleep not disrupted by back pain to aid in participation of functional ADLs during the day.   - Patient will report  worst subjective pain <=5/10 to allow return to community ambulation not limited by pain.   Plan:  CONTINUE WITH ADDITION OF NWB CORE STRENGTHENING EXERCISES. FUNCTIONAL LOWER BODY STRENGTHENING .    Total Session Time 33 and Timed code minutes 33  THERAPEUTIC EXERCISE 33 minutes      Shawnee Moore, PT  11/03/2023, [phone]

## 2023-11-04 ENCOUNTER — Ambulatory Visit
Admission: RE | Admit: 2023-11-04 | Discharge: 2023-11-04 | Disposition: A | Discharge status: Still In Hospital | Payer: Self-pay | Source: Ambulatory Visit

## 2023-11-04 NOTE — PT Treatment (Signed)
 [facility]  Outpatient Physical Therapy  [address]  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 Patient notes biggest issue is with his legs feeling heavy and weak when he has to walk 200 yards or more. Patient also notes his MD is thinking he may have neuropathy in his feet.       Objective:  Exercises per flowsheet, trial of R leg pull, no changes noted. Tried walking with trekking pole to see if adjusting weight distribution would take pressure off LB. No change noted.        EXERCISE/ACTIVITY NAME REPETITIONS RESISTANCE COMPLETED THIS DOS   Physioball   supine bridging knees slightly flexed                       Supine bridging with knees extended                       Supine LTR with abdominal contraction                       Supine resisted hip flexion single LE R/L  X6  X10   X10   X10 each  NA  NA  NA  Blue TB All added to HEP 7/17    PB supine SKT L/R      PB supine DKTC  X5  EACH  X5 na YES     Supine SLR left / right  X5 each   no    seated PB SB  Seated PB extension 10 ea  10   Yes  Yes                                                         DISCONTINUED ACTIVITIES                                             Access Code: [email or web address]  URL: [email or web address]  Date: 11/03/2023  Prepared by: SHAWNEE MOORE     Exercises  - supine bridging with gluteal activation  - 1 x daily - 4 x weekly -  3 sets - 6-7 reps - 4s hold  - Supine Lower Trunk Rotation with Swiss Ball  - 1 x daily - 7 x weekly - 3 sets - 10 reps  - Supine Hip and Knee Flexion AROM with Swiss Ball  - 1 x daily - 4 x weekly - 3 sets - 8-10 reps  - Seated Hip Abduction with Resistance  -  1 x daily - 4 x weekly - 2-3 sets - 8-10 reps     Assessment:  Tol session well, though no changes in LE weakness noted.     Rehab potential: FAIR  Short-Term Goals: 3 Weeks   - Patient will demonstrate improved lumbar AROM  by 5 - 10 degrees in sagittal plane  - Patient will be independent with progressive HEP to maximize gains from PT.   - Patient will report max 8 SPS  for  LBP to aid in participation in PT.   - Patient will exhibit functional trunk mobility and strength to allow less labored bed mobility activities due to pain.       Long-Term Goals: 6  Weeks   - Patient will demonstrate improved calf and  quad  strength of at least 4+/5 to aid in functional transfers and stair climbing.  - Patient will demonstrate  SLS  balance to 15 sec or more to aid in gait stability .    - Patient will demonstrate improved functional ability via improved Patient Specific Functional Score to at least 7.   - Patient will report sleep not disrupted by back pain to aid in participation of functional ADLs during the day.   - Patient will report  worst subjective pain <=5/10 to allow return to community ambulation not limited by pain.     Plan:  CONTINUE WITH ADDITION OF NWB CORE STRENGTHENING EXERCISES. FUNCTIONAL LOWER BODY STRENGTHENING .    Total Session Time 45, Timed code minutes 45, and Untimed code minutes 0  THERAPEUTIC EXERCISE 45 minutes      Geni Search, PTA  11/04/2023, 11:12

## 2023-11-08 ENCOUNTER — Ambulatory Visit (HOSPITAL_COMMUNITY)
Admission: RE | Admit: 2023-11-08 | Discharge: 2023-11-08 | Disposition: A | Discharge status: Still In Hospital | Payer: Self-pay | Source: Ambulatory Visit

## 2023-11-08 ENCOUNTER — Other Ambulatory Visit: Payer: Self-pay

## 2023-11-08 NOTE — PT Treatment (Signed)
 [facility] Medicine [facility]  Outpatient Physical Therapy  [address]  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 He was extremely sore today. Car travel irritates his back  and he traveled OOT over the weekend to help with a flooring project.  He was helping  flooring project  ( cutting floor  pieces ) and pacing back and forth. His LBP escalated until he had to get sit due to progressive LBP and LE pain. He worked for about 4 hours total. He is not worse from doing therapy . SPS  upon arrival = 2.  He has more pain in mornings . He will be OOT the first week of August .    Objective:        EXERCISE/ACTIVITY NAME REPETITIONS RESISTANCE COMPLETED THIS DOS   Physioball   supine bridging knees slightly flexed                       Supine bridging with knees extended                       Supine LTR with abdominal contraction                       Supine resisted hip flexion single LE R/L  X6  X10   X10   X10 each  NA  NA  NA  Blue TB No, All added to HEP 7/17    PB supine SKT L/R      PB supine DKTC  X5  EACH  X5 na no    Supine SLR left / right  X5 each   no    seated PB SB  Seated PB extension 10 ea  10   no  no    NU STEP    6 MIN  L6,   ending on L 4  YES    resisted 2 direction walk in // bars   10 laps each  heavy tubing  yes     challenged standing balance , static  firm surface     On  compliant surface   with multidirectional perturbation   2 inch foam   heavy tubing       Heavy tubing   yes      yes                          DISCONTINUED ACTIVITIES                                                Assessment:   the  pt ambulates with a wide BOS  and minimal trunk rotation. He tolerated progression to FWB core strengthening without reported difficulty. The pt stands  in  slight forward flexed posture with a high lordosis  centered at L1.       Rehab potential: FAIR  Short-Term Goals: 3 Weeks   - Patient will demonstrate improved lumbar AROM  by 5 - 10 degrees in sagittal plane  - Patient will be independent with progressive HEP to maximize gains from PT.   - Patient will report max 8 SPS  for  LBP to aid in participation in PT.   - Patient will exhibit functional trunk mobility and strength to allow less labored bed mobility activities due to pain.       Long-Term Goals: 6  Weeks   - Patient will demonstrate improved calf and  quad  strength of at least 4+/5 to aid in functional transfers and stair climbing.  - Patient will demonstrate  SLS  balance to 15 sec or more to aid in gait stability .    - Patient will demonstrate improved functional ability via improved Patient Specific Functional Score to at least 7.   - Patient will report sleep not disrupted by back pain to aid in participation of functional ADLs during the day.   - Patient will report  worst subjective pain <=5/10 to allow return to community ambulation not limited by pain.        Plan: continue with core and lower body strengthening exercises. Check hip flexor length bilat next session and start hip flexor stretching if indicated.     Total Session Time 35 and Timed code minutes 35  THERAPEUTIC EXERCISE 35 minutes      Shawnee Moore, PT  11/08/2023, 1215

## 2023-11-10 ENCOUNTER — Other Ambulatory Visit: Payer: Self-pay

## 2023-11-10 ENCOUNTER — Ambulatory Visit (HOSPITAL_COMMUNITY)
Admission: RE | Admit: 2023-11-10 | Discharge: 2023-11-10 | Disposition: A | Discharge status: Still In Hospital | Payer: Self-pay | Source: Ambulatory Visit

## 2023-11-10 NOTE — PT Treatment (Signed)
 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 Patient states he was given prescription for steroids x5 days and then Dr will call him to assess his status on 11/17/23. He states he has soreness not pain in LB, aching and occasional sharp pain in BLE with burning bilat feet. In general he feels the LLE>RLE with symptoms. He states he deals with feeling of great weakness in BLE.     Objective:  treatment delivered as noted below.     EXERCISE/ACTIVITY NAME REPETITIONS RESISTANCE COMPLETED THIS DOS   Physioball   supine bridging knees slightly flexed                       Supine bridging with knees extended                       Supine LTR with abdominal contraction                       Supine resisted hip flexion single LE R/L  X6  X10   X10   X10 each  NA  NA  NA  Blue TB No, All added to HEP 7/17    PB supine SKT L/R      PB supine DKTC  X5  EACH  X5 na no    Supine SLR left / right  X5 each   no    seated PB SB  Seated PB extension 10 ea  10   no  no    NU STEP    10 MIN     ending on L 4  YES    resisted 2 direction walk in // bars   10 laps each  heavy tubing  n     challenged standing balance , static   firm surface     On compliant surface   with multidirectional perturbation   2 inch foam   heavy tubing       Heavy tubing   n      n    hooklying hip abd: B/L/R  --marching X10 ea  x10 Green Tband  Green Tband Y  y    Modified Thomas stretch  1 min   y   Step ups 2x10 6 inch step y            DISCONTINUED ACTIVITIES  Assessment:  He does report feeling hip flexor tightness during Modified Thomas stretch. During fwd step ups when leading with R he reports 6/10 dull pain begins to occur proximal lateral gastroc, that pain is reported to ease off with rest or when  performing leading with L.      Rehab potential: FAIR  Short-Term Goals: 3 Weeks   - Patient will demonstrate improved lumbar AROM  by 5 - 10 degrees in sagittal plane  - Patient will be independent with progressive HEP to maximize gains from PT.   - Patient will report max 8 SPS  for  LBP to aid in participation in PT.   - Patient will exhibit functional trunk mobility and strength to allow less labored bed mobility activities due to pain.       Long-Term Goals: 6  Weeks   - Patient will demonstrate improved calf and  quad  strength of at least 4+/5 to aid in functional transfers and stair climbing.  - Patient will demonstrate  SLS  balance to 15 sec or more to aid in gait stability .    - Patient will demonstrate improved functional ability via improved Patient Specific Functional Score to at least 7.   - Patient will report sleep not disrupted by back pain to aid in participation of functional ADLs during the day.   - Patient will report  worst subjective pain <=5/10 to allow return to community ambulation not limited by pain.        Plan: Assess response to treatment.     Total Session Time 41 and Timed code minutes 41  THERAPEUTIC EXERCISE 41 minutes    Alfonso Ka, PTA  11/10/2023 11:03

## 2023-11-15 ENCOUNTER — Ambulatory Visit (HOSPITAL_COMMUNITY)
Admission: RE | Admit: 2023-11-15 | Discharge: 2023-11-15 | Disposition: A | Discharge status: Still In Hospital | Payer: Self-pay | Source: Ambulatory Visit

## 2023-11-15 ENCOUNTER — Other Ambulatory Visit: Payer: Self-pay

## 2023-11-15 NOTE — PT Treatment (Signed)
 [facility] Medicine [facility]  Outpatient Physical Therapy  [address]  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 Did help his daughter move over the weekend and was tired/sore from that. Does feel that PT is helping overall.      Objective:  treatment delivered as noted below.     EXERCISE/ACTIVITY NAME REPETITIONS RESISTANCE COMPLETED THIS DOS   Physioball   supine bridging knees slightly flexed                       Supine bridging with knees extended                       Supine LTR with abdominal contraction                       Supine resisted hip flexion single LE R/L  X6  X10   X10   X10 each  NA  NA  NA  Blue TB No, All added to HEP 7/17    PB supine SKT L/R      PB supine DKTC  X5  EACH  X5 na yes    Supine SLR left / right  X5 each   no    seated PB SB  Seated PB extension 10 ea  10   no  no    NU STEP    10 MIN     ending on L 4  YES    resisted 2 direction walk in // bars   10 laps each  heavy tubing  n     challenged standing balance , static  firm surface      On compliant surface   with multidirectional perturbation   2 inch foam   heavy tubing         Heavy tubing   n        n    hooklying hip abd: B/L/R  --marching X10 ea  x10 black Tband  black Tband Y  y    Modified Thomas stretch  1 min   y   Step ups 2x10 6 inch step y    Incline stretch  Hamstring stretch on step 2 x 30 sec  5 x 10 sec ea    Y  y      DISCONTINUED ACTIVITIES                                                Assessment: Good tolerance of Rx with no adverse effects. Patient will be OOT all of next week for vacation.      Rehab potential: FAIR  Short-Term Goals:  3 Weeks   - Patient will demonstrate improved lumbar AROM  by 5 - 10 degrees in sagittal plane  - Patient will be independent with progressive HEP to maximize gains from PT.   - Patient will report max 8 SPS  for  LBP to aid in participation in PT.   - Patient will exhibit functional trunk mobility and strength to allow less labored bed mobility activities due to pain.       Long-Term Goals: 6  Weeks   - Patient will demonstrate improved calf and  quad  strength of at least 4+/5 to aid in functional transfers and stair climbing.  - Patient will demonstrate  SLS  balance to 15 sec or more to aid in gait stability .    - Patient will demonstrate improved functional ability via improved Patient Specific Functional Score to at least 7.   - Patient will report sleep not disrupted by back pain to aid in participation of functional ADLs during the day.   - Patient will report  worst subjective pain <=5/10 to allow return to community ambulation not limited by pain.         Plan: Assess goals next session.        Total Session Time 35, Timed code minutes 35, and Untimed code minutes 0  THERAPEUTIC EXERCISE 35 minutes      Geni Search, PTA  11/15/2023, 11:06

## 2023-11-17 ENCOUNTER — Other Ambulatory Visit: Payer: Self-pay

## 2023-11-17 ENCOUNTER — Ambulatory Visit
Admission: RE | Admit: 2023-11-17 | Discharge: 2023-11-17 | Disposition: A | Discharge status: Still In Hospital | Payer: Self-pay | Source: Ambulatory Visit | Attending: Orthopaedic Surgery | Admitting: Orthopaedic Surgery

## 2023-11-17 NOTE — PT Treatment (Signed)
 [facility] Medicine [facility]  Outpatient Physical Therapy  [address]  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  He had a bad night due  to LLE pain.  His legs feel weaker today for some reason. He will have his second set of injections   11/28/23. He has sharp shooting pain in his left inner thigh that is random and occurs sitting  or walking  . The tingling in his lower legs has left up in the past week since starting  Lyrica  last week along with a Medrol dose pack ( 6 days, completed) . He has reduced burning in his plantar feet. He reports that he is no worse since [facility] but not better.     Objective:  treatment delivered as noted below.    standing AROM : 90  degrees flexion , 10 degrees  extension     Knee flexion excursion with squat = 90 degrees ( was 70 degrees upon SOC)  EXERCISE/ACTIVITY NAME REPETITIONS RESISTANCE COMPLETED THIS DOS   PB   supine bridging knees      flexed -  - Supine bridging with knees extended     - Supine LTR with abdominal contraction    -Supine resisted hip flexion single LE R/L    X6  X10     X10     X10 each  NA  NA  NA  Blue TB No, All added to HEP 7/17    PB supine SKT L/R      PB supine DKTC  X5  EACH  X5 na NO    Supine SLR left / right  X5 each   no    seated PB SB  Seated PB extension 10 ea  10   no  no    NU STEP    10 MIN     ending on L 4  no    resisted 2 direction walk in // bars   10  laps each  heavy tubing  yes     challenged standing balance - firm surface    -compliant surface   with multidirectional perturbation   2 inch foam   heavy tubing       Heavy tubing   yes      yes    hooklying hip abd: B/L/R  --marching X10 ea  x10 black Tband  black Tband no  no    Modified Thomas stretch  1 min   no   Step ups 2x10 6 inch step no    Incline stretch  Hamstring stretch on step 2 x 30 sec  5 x 10 sec ea   no  no   Cybex LE press - bilat  4 x 10  80 lbs , then 90 lbs yes   Cybex hip ABD  3x10  60 lbs, then 55 lbs yes      DISCONTINUED ACTIVITIES                                              Assessment: the  pt demonstrates increase lower body strength with quat compared to [facility].  His sit to stand transition is still made with heavy UE lean. The pt's exercise tolerance today was not pain inhibited and he exhibited no pain behavior with active program.    Rehab potential: FAIR    Short-Term Goals: 3 Weeks   - Patient will demonstrate improved lumbar AROM  by 5 - 10 degrees in sagittal plane ( met 11/17/23 due to gains in flexion)   - Patient will be independent with progressive HEP to maximize gains from PT. ( In progress)   - Patient will report max 8 SPS  for  LBP to aid in participation in PT.   - Patient will exhibit functional trunk mobility and strength to allow less labored bed mobility activities due to pain.       Long-Term Goals: 6  Weeks   - Patient will demonstrate improved calf and  quad  strength of at least 4+/5 to aid in functional transfers and stair climbing.  - Patient will demonstrate  SLS  balance to 15 sec or more to aid in gait stability .    - Patient will demonstrate improved functional ability via improved Patient Specific Functional Score to at least 7.   - Patient will report sleep not disrupted by back pain to aid in participation of functional ADLs during the day.   - Patient will report  worst subjective pain <=5/10 to allow return to community ambulation not limited by pain.         Plan: the pt will be OOT all next week but will return to clinic the week following. Reassess progress towards all goals upon pt's return.     Total Session Time 42 and Timed code minutes 42  THERAPEUTIC EXERCISE 42 minutes      Shawnee Moore, PT  11/17/2023, 1159

## 2023-11-29 ENCOUNTER — Ambulatory Visit (HOSPITAL_COMMUNITY)
Admission: RE | Admit: 2023-11-29 | Discharge: 2023-11-29 | Disposition: A | Discharge status: Still In Hospital | Payer: Self-pay | Source: Ambulatory Visit | Attending: Orthopaedic Surgery

## 2023-11-29 ENCOUNTER — Other Ambulatory Visit: Payer: Self-pay

## 2023-11-29 DIAGNOSIS — M51369 Other intervertebral disc degeneration, lumbar region without mention of lumbar back pain or lower extremity pain: Secondary | ICD-10-CM | POA: Insufficient documentation

## 2023-11-29 NOTE — PT Treatment (Signed)
 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 He notes  improvement in both LE pain. He does not have much back pain.  He was able to be active that afternoon without problems .  His doctor did not give him activity restrictions after injection therapy.  He was on vacation last week.  SPS upon arrival = 0-1 . He wants to use his pontoon boat again.    Objective:      standing AROM : 100  degrees flexion  ( limited by tight hamstrings)  10 degrees  extension         15 degrees SB right, 20 degrees SB left , 15 degrees bilat rotation     Functional LE strength:      Knee flexion excursion with squat = 105 degrees ( was 70 degrees upon SOC)     Pt is unable to do a SLS heel raise on either LE   EXERCISE/ACTIVITY NAME REPETITIONS RESISTANCE COMPLETED THIS DOS    Supine SLR left / right  X5 each   no    seated PB SB  Seated PB extension 10 ea  10   no  no    NU STEP    10 MIN     ending on L 4  no    resisted 2 direction walk in // bars   10 laps each   Toe to heel  steps  heavy tubing  yes     challenged standing balance - firm surface    -compliant surface   with multidirectional perturbation   2 inch foam   heavy tubing       Heavy tubing perturbation   Mediball toss with partner  yes      Yes  yes    hooklying hip abd: B/L/R  --marching X10  ea  x10 black Tband  black Tband no  no    Modified Thomas stretch  1 min   no   Step ups 2x10 6 inch step no    Incline stretch  Hamstring stretch on step 2 x 30 sec  5 x 10 sec ea   no  no   Cybex LE press - bilat   Cybex single LE press L/R   Heel raises  4 x 10   2 x 10 each  2 x 20  90 lbs  60 LBS   80 lbs Yes  Yes   yes   Cybex hip ABD  3x10 65 lbs  yes   Core stabilization standing    With mediball hand to hand pass and trunk rotation    X 20   6 lbs   yes      DISCONTINUED ACTIVITIES  All physioball exercises        for HEP                              Assessment: the  pt reports good response to his injection therapy and demonstrated improved activity tolerance . He has global stiffness of his spine.      Short-Term Goals: 3 Weeks   - Patient will demonstrate improved lumbar AROM  by 5 - 10 degrees in sagittal plane ( met 11/17/23 due to gains in flexion)   - Patient will be independent with progressive HEP to maximize gains from PT. ( In progress)   - Patient will report max 8 SPS  for  LBP to aid in participation in PT. ( Met 11/29/23)  - Patient will exhibit functional trunk mobility and strength to allow less labored bed mobility activities due to pain.       Long-Term Goals: 6  Weeks   - Patient will demonstrate improved calf and  quad  strength of at least 4+/5 to aid in functional transfers and stair climbing.  - Patient will demonstrate  SLS  balance to 15 sec or more to aid in gait stability .    - Patient will demonstrate improved functional ability via improved Patient Specific Functional Score to at least 7.   - Patient will report sleep not disrupted by back pain to aid in participation of functional ADLs during the day.   - Patient will report  worst subjective pain <=5/10 to allow return to community ambulation not limited by pain.     Plan: continue with functional core and lower body strengthening and flexibility exercises .     Total Session Time 40 and Timed code minutes  40  THERAPEUTIC EXERCISE 40 minutes      Shawnee Moore, PT  11/29/2023, [phone]

## 2023-12-01 ENCOUNTER — Ambulatory Visit (HOSPITAL_COMMUNITY)
Admission: RE | Admit: 2023-12-01 | Discharge: 2023-12-01 | Disposition: A | Discharge status: Still In Hospital | Payer: Self-pay | Source: Ambulatory Visit | Attending: Orthopaedic Surgery

## 2023-12-01 ENCOUNTER — Other Ambulatory Visit: Payer: Self-pay

## 2023-12-01 NOTE — PT Treatment (Signed)
 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 Is continuing to respond well with injections.     Objective:  Warm up on Nustep followed by exercises per flowsheet.     EXERCISE/ACTIVITY NAME REPETITIONS RESISTANCE COMPLETED THIS DOS    Supine SLR left / right  X5 each   no    seated PB SB  Seated PB extension 10 ea  10   no  no    NU STEP    6 MIN     ending on L 4  yes    resisted 2 direction walk in // bars   10 laps each   Toe to heel  steps  heavy tubing  yes     challenged standing balance - firm surface    -compliant surface   with multidirectional perturbation   2 inch foam   heavy tubing       Heavy tubing perturbation   Mediball toss with partner  yes      Yes  yes    hooklying hip abd: B/L/R  --marching X10 ea  x10 black Tband  black Tband no  no    Modified Thomas stretch  1 min   no   Step ups 2x10 6 inch step no    Incline stretch  Hamstring stretch on step 2 x 30 sec  5 x 10 sec ea   no  no   Cybex LE press - bilat   Cybex single LE press L/R   Heel raises  3 x 12   2 x 10 each  2 x 20  90 lbs  70 LBS   80 lbs Yes  Yes   yes   Cybex hip ABD  3x10 65 lbs  yes   Core stabilization standing    With mediball hand to hand pass and trunk rotation     X 20    6 lbs    yes      DISCONTINUED ACTIVITIES            All physioball exercises        for  HEP                              Assessment: Good tolerance of Rx. Mild cuing needed for ball tosses.      Short-Term Goals: 3 Weeks   - Patient will demonstrate improved lumbar AROM  by 5 - 10 degrees in sagittal plane ( met 11/17/23 due to gains in flexion)   - Patient will be independent with progressive HEP to maximize gains from PT. ( In progress)   - Patient will report max 8 SPS  for  LBP to aid in participation  in PT. ( Met 11/29/23)  - Patient will exhibit functional trunk mobility and strength to allow less labored bed mobility activities due to pain.       Long-Term Goals: 6  Weeks   - Patient will demonstrate improved calf and  quad  strength of at least 4+/5 to aid in functional transfers and stair climbing.  - Patient will demonstrate  SLS  balance to 15 sec or more to aid in gait stability .    - Patient will demonstrate improved functional ability via improved Patient Specific Functional Score to at least 7.   - Patient will report sleep not disrupted by back pain to aid in participation of functional ADLs during the day.   - Patient will report  worst subjective pain <=5/10 to allow return to community ambulation not limited by pain.      Plan: continue with functional core and lower body strengthening and flexibility exercises .        Total Session Time 40, Timed code minutes 40, and Untimed code minutes 0  THERAPEUTIC EXERCISE 40 minutes      Geni Search, PTA  12/01/2023, 09:40

## 2023-12-06 ENCOUNTER — Encounter (HOSPITAL_COMMUNITY): Payer: Self-pay

## 2023-12-06 ENCOUNTER — Other Ambulatory Visit: Payer: Self-pay

## 2023-12-06 ENCOUNTER — Ambulatory Visit (HOSPITAL_COMMUNITY)
Admission: RE | Admit: 2023-12-06 | Discharge: 2023-12-06 | Disposition: A | Discharge status: Still In Hospital | Payer: Self-pay | Source: Ambulatory Visit

## 2023-12-06 NOTE — Progress Notes (Signed)
 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 He was able to work all day at his [employer] ([employer]).  His worst SPS in past 3 days = 5  ( after a lot of activity, weed eating) .  He can take steps reciprocally at times  but other times takes them one at a time.  He is unsure if he wants to extend his current intervention and wants to wait until his 12th session to see how he is doing     Objective:       EXERCISE/ACTIVITY NAME REPETITIONS RESISTANCE COMPLETED THIS DOS    Supine SLR left / right  X5 each   no    seated PB SB  Seated PB extension 10 ea  10   no  no    NU STEP    6 MIN     ending on L 4-6  yes    resisted 2 direction walk in // bars   10 laps each   Toe to heel  steps  heavy tubing  yes     challenged standing balance - firm surface    -compliant surface   with multidirectional perturbation   2 inch foam   heavy tubing       Heavy tubing perturbation   Mediball toss with partner  no     no  no    hooklying hip abd: B/L/R  --marching X10 ea  x10 black Tband  black Tband no  no    Modified Thomas stretch  1 min   no   Step ups 2x10 6 inch step no    Incline stretch  Hamstring stretch on step 2 x 30 sec  5 x 10 sec ea   no  no   Cybex LE press - bilat   Cybex single LE press L/R   Heel raises on cybex   x 12 ,  then 3 x12  2 x 10 each  2 x 20  90 lbs, 100 lbs  70 LBS   80 lbs Yes  Yes      no   Cybex hip ABD  3x10 65 lbs  no   Core stabilization standing    With mediball hand to hand pass and trunk rotation     X 20    6 lbs    no   Resisted posterior lunges L/R in // bars  X10 each  Heavy tubing  Yes    2 resisted walk in hallway 35 ft x 6 Heavy tubing  yes   Standing heel raises off step  2 x 10    yes      DISCONTINUED ACTIVITIES  All PB exercises        for HEP                           SLS LLE = 6 sec      SLS RLE =  2 sec    - from 11/29/23 session:  standing AROM : 100  degrees flexion  ( limited by tight hamstrings)  10 degrees      extension ,      15 degrees SB right, 20 degrees SB left , 15 degrees bilat rotation     -Functional LE strength:                Knee flexion excursion with squat = 105 degrees ( was 70 degrees upon SOC)               Pt is unable to do a SLS heel raise on either LE     HEP PROGRESSION: Access Code: [email or web address]  URL: [email or web address]  Date: 12/06/2023  Prepared by: Shawnee Moore     Exercises  - Standing Bilateral Heel Raise on Step  - 1 x daily - 4-5 x weekly - 3 sets - 10 reps     Assessment:  the pt has met Medicare criteria to continue his care past  his 10th session .  He is unable to perform SLS balance due to poor proximal stability .  He was challenged performing a bilat heel raise exercise. He has tolerated a progression to PREs and FWB  dynamic stability exercises in the past 2 weeks without exacerbating his symptoms.      Short-Term Goals: 3 Weeks   - Patient will demonstrate improved lumbar AROM  by 5 - 10 degrees in sagittal plane ( met 11/17/23 due to gains in flexion)   - Patient will be independent with progressive HEP to maximize gains from PT. ( In progress)   - Patient will report max 8 SPS  for  LBP to aid in participation in PT. ( Met 11/29/23)  - Patient will exhibit functional trunk mobility and strength to allow less labored bed mobility activities  due to pain.  ( Met 8 / 19/25)     Long-Term Goals: 6  Weeks   - Patient will demonstrate improved calf and  quad  strength of at least 4+/5 to aid in functional transfers and stair climbing. ( Met 12/06/23)   - Patient will demonstrate  SLS  balance to 15 sec or more to aid in gait stability .  ( Not met 12/06/23)  - Patient will demonstrate improved functional ability via improved Patient Specific Functional Score to at least 7.   - Patient will report sleep not disrupted by back pain to aid in participation of functional ADLs during the day.  ( Met 12/06/23,  Occasionally Wakes with leg cramps vs LBP)  - Patient will report  worst subjective pain <=5/10 to allow return to community ambulation not limited by pain. ( Met 12/06/23)     Plan:   the pt has 2 sessions remaining per his original POC . His original POC dates end 12/07/23.  I will submit a revised POC to extend the pt's dates of this episode so that he can complete the originally prescribed number of sessions ( through 12/16/23).      Total Session Time 46 and Timed code minutes 46  THERAPEUTIC EXERCISE 46 minutes  Shawnee Moore, PT  12/06/2023, [phone]

## 2023-12-06 NOTE — PT Treatment (Deleted)
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 He was able to work all day at his [employer] ([employer]).  His worst SPS in past 3 days = 5  ( after a lot of activity, weed eating) .  He can take steps reciprocally at times  but other times takes them one at a time.  He is unsure if he wants to extend his current intervention and wants to wait until his 12th session to see how he is doing     Objective:       EXERCISE/ACTIVITY NAME REPETITIONS RESISTANCE COMPLETED THIS DOS    Supine SLR left / right  X5 each   no    seated PB SB  Seated PB extension 10 ea  10   no  no    NU STEP    6 MIN     ending on L 4-6  yes    resisted 2 direction walk in // bars   10 laps each   Toe to heel  steps  heavy tubing  yes     challenged standing balance - firm surface    -compliant surface   with multidirectional perturbation   2 inch foam   heavy tubing       Heavy tubing perturbation   Mediball toss with partner  no     no  no    hooklying hip abd: B/L/R  --marching X10 ea  x10 black Tband  black Tband no  no    Modified Thomas stretch  1 min   no   Step ups 2x10 6 inch step no    Incline stretch  Hamstring stretch on step 2 x 30 sec  5 x 10 sec ea   no  no   Cybex LE press - bilat   Cybex single LE press L/R   Heel raises on cybex   x 12 ,  then 3 x12  2 x 10 each  2 x 20  90 lbs, 100 lbs  70 LBS   80 lbs Yes  Yes      no   Cybex hip ABD  3x10 65 lbs  no   Core stabilization standing    With mediball hand to hand pass and trunk rotation     X 20    6 lbs    no   Resisted posterior lunges L/R in // bars  X10 each  Heavy tubing  Yes    2 resisted walk in hallway 35 ft x 6 Heavy tubing  yes   Standing heel raises off step  2 x 10    yes      DISCONTINUED ACTIVITIES  All PB exercises        for HEP                           SLS LLE = 6 sec      SLS RLE =  2 sec    - from 11/29/23 session:  standing AROM : 100  degrees flexion  ( limited by tight hamstrings)  10 degrees      extension ,      15 degrees SB right, 20 degrees SB left , 15 degrees bilat rotation     -Functional LE strength:                Knee flexion excursion with squat = 105 degrees ( was 70 degrees upon SOC)               Pt is unable to do a SLS heel raise on either LE     HEP PROGRESSION: Access Code: [email or web address]  URL: [email or web address]  Date: 12/06/2023  Prepared by: Shawnee Moore     Exercises  - Standing Bilateral Heel Raise on Step  - 1 x daily - 4-5 x weekly - 3 sets - 10 reps     Assessment:  the pt has met Medicare criteria to continue his care past  his 10th session .  He is unable to perform SLS balance due to poor proximal stability .  He was challenged performing a bilat heel raise exercise. He has tolerated a progression to PREs and FWB  dynamic stability exercises in the past 2 weeks without exacerbating his symptoms.      Short-Term Goals: 3 Weeks   - Patient will demonstrate improved lumbar AROM  by 5 - 10 degrees in sagittal plane ( met 11/17/23 due to gains in flexion)   - Patient will be independent with progressive HEP to maximize gains from PT. ( In progress)   - Patient will report max 8 SPS  for  LBP to aid in participation in PT. ( Met 11/29/23)  - Patient will exhibit functional trunk mobility and strength to allow less labored bed mobility activities  due to pain.  ( Met 8 / 19/25)     Long-Term Goals: 6  Weeks   - Patient will demonstrate improved calf and  quad  strength of at least 4+/5 to aid in functional transfers and stair climbing. ( Met 12/06/23)   - Patient will demonstrate  SLS  balance to 15 sec or more to aid in gait stability .  ( Not met 12/06/23)  - Patient will demonstrate improved functional ability via improved Patient Specific Functional Score to at least 7.   - Patient will report sleep not disrupted by back pain to aid in participation of functional ADLs during the day.  ( Met 12/06/23,  Occasionally Wakes with leg cramps vs LBP)  - Patient will report  worst subjective pain <=5/10 to allow return to community ambulation not limited by pain. ( Met 12/06/23)     Plan:   the pt has 2 sessions remaining per his original POC . His original POC dates end 12/07/23.  I will submit a revised POC to extend the pt's dates of this episode so that he can complete the originally prescribed number of sessions ( through 12/16/23).      Total Session Time 46 and Timed code minutes 46  THERAPEUTIC EXERCISE 46 minutes  0946

## 2023-12-13 ENCOUNTER — Ambulatory Visit (HOSPITAL_COMMUNITY)
Admission: RE | Admit: 2023-12-13 | Discharge: 2023-12-13 | Disposition: A | Discharge status: Still In Hospital | Payer: Self-pay | Source: Ambulatory Visit

## 2023-12-13 ENCOUNTER — Other Ambulatory Visit: Payer: Self-pay

## 2023-12-13 NOTE — PT Treatment (Signed)
 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 States he avoids anything heavy lifting, and gets help when needed.     Objective:       EXERCISE/ACTIVITY NAME REPETITIONS RESISTANCE COMPLETED THIS DOS    Supine SLR left / right  X5 each   no    seated PB SB  Seated PB extension 10 ea  10   no  no    NU STEP    7 MIN     ending on L 4-6  yes    resisted 2 direction walk in // bars   10 laps each   Toe to heel  steps  heavy tubing  yes     challenged standing balance - firm surface    -compliant surface   with multidirectional perturbation   2 inch foam   heavy tubing       Heavy tubing perturbation   Mediball toss with partner  no     no  no    hooklying hip abd: B/L/R  --marching X10 ea  x10 black Tband  black Tband no  no    Modified Thomas stretch  1 min   no   Step ups 2x10 6 inch step no    Incline stretch  Hamstring stretch on step 2 x 30 sec  5 x 10 sec ea   no  no   Tandem balance 3 trips  yes   Cybex LE press - bilat   Cybex single LE press L/R   Heel raises on cybex   x 12 , then 3 x12  2 x 10 each  2 x 20  90 lbs, 100 lbs  70 LBS   80 lbs Yes  Yes      no   Cybex hip ABD  3x10 65 lbs  yes   Core stabilization standing    With mediball hand to hand pass and trunk  rotation     X 20    6 lbs    no   Resisted posterior lunges L/R in // bars  X10 each  Heavy tubing  Yes    2 resisted walk in hallway 35 ft x 6 Heavy tubing  yes   Standing heel raises off step  2 x 10    yes      DISCONTINUED ACTIVITIES          All PB exercises        for HEP                               HEP PROGRESSION: Access Code:  [email or web address]  URL: [email or web address]  Date: 12/06/2023  Prepared by: Shawnee Moore     Exercises  - Standing Bilateral Heel Raise on Step  - 1 x daily - 4-5 x weekly - 3 sets - 10 reps     Assessment:  Good tolerance of Rx with no adverse effects.     Short-Term Goals: 3 Weeks   - Patient will demonstrate improved lumbar AROM  by 5 - 10 degrees in sagittal plane ( met 11/17/23 due to gains in flexion)   - Patient will be independent with progressive HEP to maximize gains from PT. ( In progress)   - Patient will report max 8 SPS  for  LBP to aid in participation in PT. ( Met 11/29/23)  - Patient will exhibit functional trunk mobility and strength to allow less labored bed mobility activities due to pain.  ( Met 8 / 19/25)     Long-Term Goals: 6  Weeks   - Patient will demonstrate improved calf and  quad  strength of at least 4+/5 to aid in functional transfers and stair climbing. ( Met 12/06/23)   - Patient will demonstrate  SLS  balance to 15 sec or more to aid in gait stability .  ( Not met 12/06/23)  - Patient will demonstrate improved functional ability via improved Patient Specific Functional Score to at least 7.   - Patient will report sleep not disrupted by back pain to aid in participation of functional ADLs during the day.  ( Met 12/06/23,  Occasionally Wakes with leg cramps vs LBP)  - Patient will report  worst subjective pain <=5/10 to allow return to community ambulation not limited by pain. ( Met 12/06/23)     Plan:  Cont with POC.        Total Session Time 40, Timed code minutes 40, and Untimed code minutes 0  THERAPEUTIC EXERCISE 40 minutes      Geni Search, PTA   12/13/2023, 09:38

## 2023-12-15 ENCOUNTER — Other Ambulatory Visit: Payer: Self-pay

## 2023-12-15 ENCOUNTER — Ambulatory Visit
Admission: RE | Admit: 2023-12-15 | Discharge: 2023-12-15 | Disposition: A | Discharge status: Still In Hospital | Payer: Self-pay | Source: Ambulatory Visit | Attending: Orthopaedic Surgery | Admitting: Orthopaedic Surgery

## 2023-12-15 NOTE — Progress Notes (Addendum)
 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 SEE THE BELOW NOTE FOR His STATUS UPON His LAST SESSION. THE PT IS DISCHARGED FROM THIS EPISODE OF CARE. SHAWNEE MOORE, PT      Subjective:  his knees buckle on him several times a week. He does not fall however.  He denies LBP since injection. He has some left lateral calf pain  that is intermittent and not daily. He associates this pain with WB activity. His legs still get weak. His legs do not feel as weak after the injection. His worst SPS in past week = 5-6  ( bilat lateral leg pain) . He is very active. He can barely climb a ladder.    Objective:       EXERCISE/ACTIVITY NAME REPETITIONS RESISTANCE COMPLETED THIS DOS    Supine SLR left / right  X5 each   no    seated PB SB  Seated PB extension 10 ea  10   no  no    NU STEP    6 MIN     ending on L 4-6  yes    resisted 2 direction walk in // bars   10 laps each   Toe to heel  steps  heavy tubing  yes     challenged standing balance - firm surface    -compliant surface   with multidirectional perturbation   2 inch foam   heavy tubing       Heavy tubing perturbation   Mediball toss with partner  no     no  no    hooklying hip abd: B/L/R  --marching X10 ea  x10 black Tband  black Tband no  no    Modified Thomas stretch  1  min   no   Step ups 2x10 6 inch step no    Incline stretch  Hamstring stretch on step 2 x 30 sec  5 x 10 sec ea   no  no   Cybex LE press - bilat   Cybex single LE press L/R   Heel raises on cybex   x 10 , then 3 x12  2 x 10 each  2 x 20  90 lbs, 100 lbs  70 LBS   80 lbs Yes  Yes      no   Cybex hip ABD  3x10 65 lbs  no   Core stabilization standing    With mediball hand to hand pass and trunk rotation     X 20    6 lbs    no   Resisted posterior lunges L/R in // bars  X10 each  Heavy tubing  Yes    2 resisted walk in hallway 35 ft x 6 Heavy tubing  yes  Standing heel raises off step  2 x 10    yes      DISCONTINUED ACTIVITIES          All PB exercises        for HEP                           SLS LLE = 5 sec      SLS RLE =  1 sec    standing AROM : 100  degrees flexion  ( limited by tight hamstrings)  10 degrees      extension ,      15 degrees SB right, 20 degrees SB left , 15 degrees bilat rotation     -Functional LE strength:                Knee flexion excursion with squat = 105 degrees ( was 70 degrees upon SOC)               Pt is unable to do a SLS heel raise on either LE ( unchanged from [facility]   PSFS = 7.3 ( WAS 3.6 UPON SOC)      HEP PROGRESSION: Access Code: [email or web address]  URL: [email or web address]  Date: 12/06/2023  Prepared by: Shawnee Moore     Exercises  - Standing Bilateral Heel Raise on Step  - 1 x daily - 4-5 x weekly - 3 sets - 10 reps     Assessment:  the pt  has had no change in his calf strength since SOC.  He is unable to do SLS  balance  or a SLS heel raise. His pain  however is significantly reduced since his injection therapy. He has functional tolerance of sustained WB activity but does note progressive LE  weakness  walking longer distances . He  does not report amy change  his flexibility especially the ability to bend over at the waist.  However , he exhibits trunk flexion ROM that is [facility]  and did not c/o pain with standing flexion in the clinic. The pt's ability to bend at the waist is  limited by his calf and hamstring weakness ).     Short-Term Goals: 3 Weeks   - Patient will demonstrate improved lumbar AROM  by 5 - 10 degrees in sagittal plane ( met 11/17/23 due to gains in flexion)   - Patient will be independent with progressive HEP to maximize gains from PT. ( In progress)   - Patient will report max 8 SPS  for  LBP to aid in participation in PT. ( Met 11/29/23)  - Patient will exhibit functional trunk mobility and strength to allow less labored bed mobility activities due to pain.  ( Met 8 / 19/25)     Long-Term Goals: 6  Weeks   - Patient will demonstrate improved calf and  quad  strength of at least 4+/5 to aid in functional transfers and stair climbing. ( Met 12/06/23)   - Patient will demonstrate  SLS  balance to 15 sec or more to aid in gait stability .  ( Not met 12/15/23)  - Patient will demonstrate improved functional ability via improved Patient Specific Functional Score to at least 7. ( Met 12/15/23)  - Patient will report sleep not disrupted by back pain to aid in participation of functional ADLs during the day.  ( Met 12/06/23 )  - Patient will report  worst subjective pain <=5/10 to allow return  to community ambulation not limited by pain. ( Met 12/06/23)    Plan:  pt to pursue his HEP  indep  until FU with MD . At this time the pt does not want to extend his therapy participation until he sees his referring MD. The value of continued therapy at this junction is questionable due to lack of strength gains in calf mm from [facility].     Total Session Time 37 and Timed code minutes 37  THERAPEUTIC EXERCISE 37 minutes      Shawnee Moore, PT  12/15/2023,0930

## 2024-02-02 ENCOUNTER — Other Ambulatory Visit (HOSPITAL_COMMUNITY): Payer: Self-pay | Admitting: Family

## 2024-02-02 DIAGNOSIS — K760 Fatty (change of) liver, not elsewhere classified: Secondary | ICD-10-CM

## 2024-03-19 ENCOUNTER — Other Ambulatory Visit: Payer: Self-pay

## 2024-03-19 ENCOUNTER — Ambulatory Visit
Admission: RE | Admit: 2024-03-19 | Discharge: 2024-03-19 | Disposition: A | Source: Ambulatory Visit | Attending: Family

## 2024-03-19 DIAGNOSIS — Z9049 Acquired absence of other specified parts of digestive tract: Secondary | ICD-10-CM

## 2024-03-19 DIAGNOSIS — K760 Fatty (change of) liver, not elsewhere classified: Secondary | ICD-10-CM
